# Patient Record
Sex: Female | Born: 1970 | Race: White | Hispanic: No | Marital: Married | Smoking: Never smoker
Health system: Southern US, Community
[De-identification: ages and names within clinical notes are randomized; demographics above are authoritative.]

## PROBLEM LIST (undated history)

## (undated) DIAGNOSIS — Z9189 Other specified personal risk factors, not elsewhere classified: Secondary | ICD-10-CM

## (undated) DIAGNOSIS — Z975 Presence of (intrauterine) contraceptive device: Secondary | ICD-10-CM

## (undated) HISTORY — DX: Presence of (intrauterine) contraceptive device: Z97.5

## (undated) HISTORY — DX: Other specified personal risk factors, not elsewhere classified: Z91.89

---

## 2007-11-16 ENCOUNTER — Ambulatory Visit: Payer: Self-pay

## 2008-07-28 HISTORY — PX: BREAST EXCISIONAL BIOPSY: SUR124

## 2008-11-22 ENCOUNTER — Ambulatory Visit: Payer: Self-pay

## 2009-01-25 HISTORY — PX: BREAST FIBROADENOMA SURGERY: SHX580

## 2009-02-09 ENCOUNTER — Ambulatory Visit: Payer: Self-pay | Admitting: Surgery

## 2009-02-16 ENCOUNTER — Ambulatory Visit: Payer: Self-pay | Admitting: Surgery

## 2010-02-14 ENCOUNTER — Ambulatory Visit: Payer: Self-pay | Admitting: Obstetrics and Gynecology

## 2013-01-25 LAB — HM MAMMOGRAPHY: HM Mammogram: NORMAL

## 2013-01-31 LAB — HM PAP SMEAR: HM PAP: NORMAL

## 2013-02-07 ENCOUNTER — Ambulatory Visit: Payer: Self-pay | Admitting: Obstetrics and Gynecology

## 2015-02-22 ENCOUNTER — Encounter: Payer: Self-pay | Admitting: Nurse Practitioner

## 2015-02-22 ENCOUNTER — Encounter (INDEPENDENT_AMBULATORY_CARE_PROVIDER_SITE_OTHER): Payer: Self-pay

## 2015-02-22 ENCOUNTER — Ambulatory Visit (INDEPENDENT_AMBULATORY_CARE_PROVIDER_SITE_OTHER): Payer: No Typology Code available for payment source | Admitting: Nurse Practitioner

## 2015-02-22 VITALS — BP 110/78 | HR 70 | Temp 97.7°F | Resp 14 | Ht 68.0 in | Wt 179.0 lb

## 2015-02-22 DIAGNOSIS — R6889 Other general symptoms and signs: Secondary | ICD-10-CM | POA: Diagnosis not present

## 2015-02-22 DIAGNOSIS — R198 Other specified symptoms and signs involving the digestive system and abdomen: Secondary | ICD-10-CM

## 2015-02-22 DIAGNOSIS — Z7689 Persons encountering health services in other specified circumstances: Secondary | ICD-10-CM | POA: Insufficient documentation

## 2015-02-22 DIAGNOSIS — R55 Syncope and collapse: Secondary | ICD-10-CM

## 2015-02-22 DIAGNOSIS — Z7189 Other specified counseling: Secondary | ICD-10-CM

## 2015-02-22 DIAGNOSIS — R194 Change in bowel habit: Secondary | ICD-10-CM

## 2015-02-22 NOTE — Progress Notes (Signed)
Pre visit review using our clinic review tool, if applicable. No additional management support is needed unless otherwise documented below in the visit note. 

## 2015-02-22 NOTE — Progress Notes (Signed)
Subjective:    Patient ID: Deanna Leonard, female    DOB: 1970-09-01, 44 y.o.   MRN: [id number]  HPI  Deanna Leonard is a 44 yo female establishing care and CC of forgetfulness and frequent BMs.  1) New pt info:   Immunizations- Unknown   Mammogram- 2014  Pap- 2014 OB/GYN  Eye Exam- 06/2014  Dental Exam- 08/2014   Mirena- IUD- maybe 5 years or less   2) Chronic Problems-  History of syncope- 1 year ago last episode, happened after a leg cramp at night. 3-4 years ago felt pre-syncopal    3) Acute Problems-  Forgetful- feels increasingly fuzzy and forgets names occasionally   Bowel movements- past few months, happens occasionally, coffee is a trigger, job stress has increased   Review of Systems  Constitutional: Negative for fever, chills, diaphoresis and fatigue.  Respiratory: Negative for chest tightness, shortness of breath and wheezing.   Cardiovascular: Negative for chest pain, palpitations and leg swelling.  Gastrointestinal: Negative for nausea, vomiting, diarrhea and constipation.  Skin: Negative for rash.  Neurological: Negative for dizziness, weakness, numbness and headaches.  Psychiatric/Behavioral: Positive for decreased concentration. The patient is not nervous/anxious.    Past Medical History  Diagnosis Date  . History of fainting     History   Social History  . Marital Status: Married    Spouse Name: N/A  . Number of Children: N/A  . Years of Education: N/A   Occupational History  . Not on file.   Social History Main Topics  . Smoking status: Never Smoker   . Smokeless tobacco: Never Used  . Alcohol Use: No  . Drug Use: No  . Sexual Activity:    Partners: Male    Birth Control/ Protection: IUD     Comment: Husband   Other Topics Concern  . 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 Breast fibroadenoma surgery  July 2010    Left side under arm    Family History  Problem Relation Age of Onset  . Cancer Mother     Colon Cancer  . Hypertension Mother   . Hypertension Father   . Diabetes Father   . Congestive Heart Failure Father   . Glaucoma Father   . Macular degeneration Father     No Known Allergies  No current outpatient prescriptions on file prior to visit.   No current facility-administered medications on file prior to visit.       Objective:   Physical Exam  Constitutional: She is oriented to person, place, and time. She appears well-developed and well-nourished. No distress.  BP 110/78 mmHg  Pulse 70  Temp(Src) 97.7 F (36.5 C)  Resp 14  Ht  (1.727 m)  Wt 179 lb (81.194 kg)  BMI 27.22 kg/m2  SpO2 98%   HENT:  Head: Normocephalic and atraumatic.  Right Ear: External ear normal.  Left Ear: External ear normal.  Cardiovascular: Normal rate, regular rhythm, normal heart sounds and intact distal pulses.  Exam reveals no gallop and no friction rub.   No murmur heard. Pulmonary/Chest: Effort normal and breath sounds normal. No respiratory distress. She has no wheezes. She has no rales. She exhibits no tenderness.  Neurological: She is alert and oriented  to person, place, and time. No cranial nerve deficit. She exhibits normal muscle tone. Coordination normal.  Skin: Skin is warm and dry. No rash noted. She is not diaphoretic.  Psychiatric: She has a normal mood and affect. Her behavior is normal. Judgment and thought content normal.      Assessment & Plan:

## 2015-02-22 NOTE — Patient Instructions (Addendum)
Schedule your physical exam. We will get fasting labs (nothing to eat or drink after midnight except water and black coffee).   Welcome to [facility]! Nice to meet you!

## 2015-03-04 DIAGNOSIS — R194 Change in bowel habit: Secondary | ICD-10-CM | POA: Insufficient documentation

## 2015-03-04 DIAGNOSIS — R55 Syncope and collapse: Secondary | ICD-10-CM | POA: Insufficient documentation

## 2015-03-04 DIAGNOSIS — R6889 Other general symptoms and signs: Secondary | ICD-10-CM | POA: Insufficient documentation

## 2015-03-04 NOTE — Assessment & Plan Note (Signed)
History of syncope. 1 year ago was her last episode. She reports pre-syncope.

## 2015-03-04 NOTE — Assessment & Plan Note (Signed)
Pt reports worsening frequency of BMs. She has had increased stress and reports coffee as a trigger. FU prn worsening/failure to improve.

## 2015-03-04 NOTE — Assessment & Plan Note (Signed)
Discussed with pt. Fuzziness can be from multiple etiologies. Discussed possibly due to increasing stress. Will follow.

## 2015-03-04 NOTE — Assessment & Plan Note (Signed)
Discussed acute and chronic issues. Reviewed health maintenance measures, PFSHx, and immunizations. Obtain records from previous facility.   

## 2015-03-14 ENCOUNTER — Encounter: Payer: No Typology Code available for payment source | Admitting: Nurse Practitioner

## 2015-03-26 ENCOUNTER — Encounter: Payer: No Typology Code available for payment source | Admitting: Nurse Practitioner

## 2015-04-04 ENCOUNTER — Encounter: Payer: No Typology Code available for payment source | Admitting: Nurse Practitioner

## 2015-04-04 ENCOUNTER — Encounter: Payer: Self-pay | Admitting: Nurse Practitioner

## 2015-04-04 ENCOUNTER — Encounter (INDEPENDENT_AMBULATORY_CARE_PROVIDER_SITE_OTHER): Payer: Self-pay

## 2015-04-04 ENCOUNTER — Ambulatory Visit (INDEPENDENT_AMBULATORY_CARE_PROVIDER_SITE_OTHER): Payer: No Typology Code available for payment source | Admitting: Nurse Practitioner

## 2015-04-04 VITALS — BP 110/80 | HR 70 | Temp 98.7°F | Resp 14 | Ht 68.0 in | Wt 177.8 lb

## 2015-04-04 DIAGNOSIS — Z0000 Encounter for general adult medical examination without abnormal findings: Secondary | ICD-10-CM | POA: Diagnosis not present

## 2015-04-04 DIAGNOSIS — Z23 Encounter for immunization: Secondary | ICD-10-CM | POA: Diagnosis not present

## 2015-04-04 LAB — COMPREHENSIVE METABOLIC PANEL
ALT: 23 U/L (ref 0–35)
AST: 16 U/L (ref 0–37)
Albumin: 4.4 g/dL (ref 3.5–5.2)
Alkaline Phosphatase: 59 U/L (ref 39–117)
BUN: 15 mg/dL (ref 6–23)
CHLORIDE: 103 meq/L (ref 96–112)
CO2: 27 mEq/L (ref 19–32)
CREATININE: 0.78 mg/dL (ref 0.40–1.20)
Calcium: 9.1 mg/dL (ref 8.4–10.5)
GFR: 84.75 mL/min (ref >60.00)
GLUCOSE: 88 mg/dL (ref 70–99)
POTASSIUM: 4.1 meq/L (ref 3.5–5.1)
Sodium: 137 mEq/L (ref 135–145)
Total Bilirubin: 0.7 mg/dL (ref 0.2–1.2)
Total Protein: 7.1 g/dL (ref 6.0–8.3)

## 2015-04-04 LAB — CBC WITH DIFFERENTIAL/PLATELET
BASOS ABS: 0 10*3/uL (ref 0.0–0.1)
Basophils Relative: 0.5 % (ref 0.0–3.0)
EOS ABS: 0 10*3/uL (ref 0.0–0.7)
Eosinophils Relative: 0.3 % (ref 0.0–5.0)
HCT: 43 % (ref 36.0–46.0)
Hemoglobin: 14.5 g/dL (ref 12.0–15.0)
LYMPHS ABS: 2.2 10*3/uL (ref 0.7–4.0)
Lymphocytes Relative: 31 % (ref 12.0–46.0)
MCHC: 33.6 g/dL (ref 30.0–36.0)
MCV: 98.7 fl (ref 78.0–100.0)
MONO ABS: 0.3 10*3/uL (ref 0.1–1.0)
Monocytes Relative: 4.5 % (ref 3.0–12.0)
Neutro Abs: 4.5 10*3/uL (ref 1.4–7.7)
Neutrophils Relative %: 63.7 % (ref 43.0–77.0)
Platelets: 236 10*3/uL (ref 150.0–400.0)
RBC: 4.36 Mil/uL (ref 3.87–5.11)
RDW: 12.8 % (ref 11.5–15.5)
WBC: 7.1 10*3/uL (ref 4.0–10.5)

## 2015-04-04 LAB — LIPID PANEL
CHOL/HDL RATIO: 4
Cholesterol: 169 mg/dL (ref 0–200)
HDL: 43.7 mg/dL (ref >39.00)
LDL CALC: 94 mg/dL (ref 0–99)
NonHDL: 125.69
Triglycerides: 157 mg/dL — ABNORMAL HIGH (ref 0.0–149.0)
VLDL: 31.4 mg/dL (ref 0.0–40.0)

## 2015-04-04 LAB — VITAMIN D 25 HYDROXY (VIT D DEFICIENCY, FRACTURES): VITD: 21.63 ng/mL — AB (ref 30.00–100.00)

## 2015-04-04 LAB — HEMOGLOBIN A1C: HEMOGLOBIN A1C: 5.3 % (ref 4.6–6.5)

## 2015-04-04 NOTE — Assessment & Plan Note (Signed)
Discussed acute and chronic issues. Reviewed health maintenance measures, PFSHx, and immunizations. Obtain routine labs TSH, Lipid panel, CBC w/ diff, A1c, Vitamin D, and CMET.   Tdap immunization updated today

## 2015-04-04 NOTE — Patient Instructions (Signed)

## 2015-04-04 NOTE — Progress Notes (Signed)
Pre visit review using our clinic review tool, if applicable. No additional management support is needed unless otherwise documented below in the visit note. 

## 2015-04-04 NOTE — Progress Notes (Signed)
Patient ID: Deanna Leonard, female    DOB: March 29, 1971  Age: 44 y.o. MRN: [id number]  CC: Annual Exam   HPI Deanna Leonard presents for annual physical exam.   1) Health Maintenance-   Diet- Eats healthy, cooks more often   Exercise- Walking intermittently, will start more often with cooler weather   Immunizations- Tdap unknown; refuses flu   Mammogram- 2014, needs referral   Pap- 01/31/2013  Eye Exam- 2015  Dental Exam- 02/2015  LMP- IUD  Breast exam- needs clinical exam  History Deanna Leonard has a past medical history of History of fainting.   She has past surgical history that includes Breast fibroadenoma surgery (July 2010).   Her family history includes Cancer in her mother; Congestive Heart Failure in her father; Diabetes in her father; Glaucoma in her father; Hypertension in her father and mother; Macular degeneration in her father.She reports that she has never smoked. She has never used smokeless tobacco. She reports that she does not drink alcohol or use illicit drugs.  No outpatient prescriptions prior to visit.   No facility-administered medications prior to visit.    ROS Review of Systems  Constitutional: Negative for fever, chills, diaphoresis and fatigue.  HENT: Negative for tinnitus and trouble swallowing.   Respiratory: Negative for chest tightness, shortness of breath and wheezing.   Cardiovascular: Negative for chest pain, palpitations and leg swelling.  Gastrointestinal: Negative for nausea, vomiting and diarrhea.  Genitourinary: Negative for difficulty urinating.  Musculoskeletal: Negative for back pain and neck pain.  Skin: Negative for rash.  Neurological: Negative for dizziness, weakness, numbness and headaches.  Psychiatric/Behavioral: Negative for suicidal ideas and sleep disturbance. The patient is not nervous/anxious.     Objective:  BP 110/80 mmHg  Pulse 70  Temp(Src) 98.7 F (37.1 C)  Resp 14  Ht  (1.727 m)  Wt 177 lb 12.8 oz (80.65 kg)   BMI 27.04 kg/m2  SpO2 97%  Physical Exam  Constitutional: She is oriented to person, place, and time. She appears well-developed and well-nourished. No distress.  HENT:  Head: Normocephalic and atraumatic.  Right Ear: External ear normal.  Left Ear: External ear normal.  Mouth/Throat: No oropharyngeal exudate.  Eyes: EOM are normal. Pupils are equal, round, and reactive to light. Right eye exhibits no discharge. Left eye exhibits no discharge. No scleral icterus.  Neck: Normal range of motion. Neck supple. No thyromegaly present.  Cardiovascular: Normal rate, regular rhythm and normal heart sounds.  Exam reveals no gallop and no friction rub.   No murmur heard. Pulmonary/Chest: Effort normal and breath sounds normal. No respiratory distress. She has no wheezes. She has no rales. She exhibits no tenderness.  Clinical breast exam performed and dense fibroglandular breast tissue palpated bilaterally   Abdominal: Soft. Bowel sounds are normal. She exhibits no distension and no mass. There is no tenderness. There is no rebound and no guarding.  Genitourinary:  Deferred till next year  Musculoskeletal: Normal range of motion. She exhibits no edema or tenderness.  Lymphadenopathy:    She has no cervical adenopathy.  Neurological: She is alert and oriented to person, place, and time. No cranial nerve deficit. She exhibits normal muscle tone. Coordination normal.  Skin: Skin is warm and dry. No rash noted. She is not diaphoretic.  Psychiatric: She has a normal mood and affect. Her behavior is normal. Judgment and thought content normal.   Assessment & Plan:   Deanna Leonard was seen today for annual exam.  Diagnoses and all orders  for this visit:  Routine general medical examination at a health care facility -     Comprehensive metabolic panel -     Lipid panel -     Hemoglobin A1c -     CBC with Differential/Platelet -     Vitamin D (25 hydroxy)  Need for Tdap vaccination -     Tdap vaccine  greater than or equal to 7yo IM   Deanna Leonard does not currently have medications on file.  No orders of the defined types were placed in this encounter.     Follow-up: Return in about 1 year (around 04/03/2016) for CPE.

## 2015-04-12 ENCOUNTER — Other Ambulatory Visit: Payer: Self-pay | Admitting: Obstetrics and Gynecology

## 2015-04-12 ENCOUNTER — Other Ambulatory Visit: Payer: Self-pay | Admitting: Nurse Practitioner

## 2015-04-12 DIAGNOSIS — Z1231 Encounter for screening mammogram for malignant neoplasm of breast: Secondary | ICD-10-CM

## 2015-04-24 ENCOUNTER — Ambulatory Visit: Payer: No Typology Code available for payment source

## 2015-05-15 ENCOUNTER — Ambulatory Visit
Admission: RE | Admit: 2015-05-15 | Discharge: 2015-05-15 | Disposition: A | Discharge status: Home / Self Care | Payer: No Typology Code available for payment source | Source: Ambulatory Visit | Attending: Nurse Practitioner | Admitting: Nurse Practitioner

## 2015-05-15 DIAGNOSIS — Z1231 Encounter for screening mammogram for malignant neoplasm of breast: Secondary | ICD-10-CM | POA: Insufficient documentation

## 2015-06-25 NOTE — Discharge Instructions (Signed)

## 2015-06-27 ENCOUNTER — Ambulatory Visit: Payer: No Typology Code available for payment source | Admitting: Anesthesiology

## 2015-06-27 ENCOUNTER — Encounter: Payer: Self-pay | Admitting: *Deleted

## 2015-06-27 ENCOUNTER — Ambulatory Visit
Admission: RE | Admit: 2015-06-27 | Discharge: 2015-06-27 | Disposition: A | Discharge status: Home / Self Care | Payer: No Typology Code available for payment source | Source: Ambulatory Visit | Attending: Ophthalmology | Admitting: Ophthalmology

## 2015-06-27 ENCOUNTER — Encounter
Admission: RE | Disposition: A | Discharge status: Home / Self Care | Payer: Self-pay | Source: Ambulatory Visit | Attending: Ophthalmology

## 2015-06-27 DIAGNOSIS — Z79899 Other long term (current) drug therapy: Secondary | ICD-10-CM | POA: Insufficient documentation

## 2015-06-27 DIAGNOSIS — H2512 Age-related nuclear cataract, left eye: Secondary | ICD-10-CM | POA: Diagnosis not present

## 2015-06-27 HISTORY — PX: CATARACT EXTRACTION W/PHACO: SHX586

## 2015-06-27 SURGERY — PHACOEMULSIFICATION, CATARACT, WITH IOL INSERTION
Anesthesia: Monitor Anesthesia Care | Laterality: Left | Wound class: Clean

## 2015-06-27 MED ORDER — MIDAZOLAM HCL 2 MG/2ML IJ SOLN
INTRAMUSCULAR | Status: DC | PRN
  Administered 2015-06-27 (×2): 2 mg via INTRAVENOUS

## 2015-06-27 MED ORDER — ACETAMINOPHEN 160 MG/5ML PO SOLN: 325.0000 mg | ORAL | Status: DC | PRN

## 2015-06-27 MED ORDER — CEFUROXIME OPHTHALMIC INJECTION 1 MG/0.1 ML
INJECTION | OPHTHALMIC | Status: DC | PRN
  Administered 2015-06-27: 0.1 mL via INTRACAMERAL

## 2015-06-27 MED ORDER — ARMC OPHTHALMIC DILATING GEL
1.0000 | OPHTHALMIC | Status: DC | PRN
Start: 2015-06-27 — End: 2015-06-27
  Administered 2015-06-27 (×2): 1 via OPHTHALMIC

## 2015-06-27 MED ORDER — TIMOLOL MALEATE 0.5 % OP SOLN
OPHTHALMIC | Status: DC | PRN
  Administered 2015-06-27: 1 [drp] via OPHTHALMIC

## 2015-06-27 MED ORDER — ACETAMINOPHEN 325 MG PO TABS: 325.0000 mg | ORAL_TABLET | ORAL | Status: DC | PRN

## 2015-06-27 MED ORDER — NA HYALUR & NA CHOND-NA HYALUR 0.4-0.35 ML IO KIT
PACK | INTRAOCULAR | Status: DC | PRN
  Administered 2015-06-27: 1 mL via INTRAOCULAR

## 2015-06-27 MED ORDER — POVIDONE-IODINE 5 % OP SOLN
1.0000 "application " | OPHTHALMIC | Status: DC | PRN
  Administered 2015-06-27: 1 via OPHTHALMIC

## 2015-06-27 MED ORDER — BRIMONIDINE TARTRATE 0.2 % OP SOLN
OPHTHALMIC | Status: DC | PRN
  Administered 2015-06-27: 1 [drp] via OPHTHALMIC

## 2015-06-27 MED ORDER — LIDOCAINE HCL (PF) 4 % IJ SOLN
INTRAOCULAR | Status: DC | PRN
  Administered 2015-06-27: 1 mL via OPHTHALMIC

## 2015-06-27 MED ORDER — FENTANYL CITRATE (PF) 100 MCG/2ML IJ SOLN
INTRAMUSCULAR | Status: DC | PRN
  Administered 2015-06-27: 100 ug via INTRAVENOUS

## 2015-06-27 MED ORDER — TETRACAINE HCL 0.5 % OP SOLN
1.0000 [drp] | OPHTHALMIC | Status: DC | PRN
  Administered 2015-06-27: 1 [drp] via OPHTHALMIC

## 2015-06-27 MED ORDER — EPINEPHRINE HCL 1 MG/ML IJ SOLN
INTRAOCULAR | Status: DC | PRN
  Administered 2015-06-27: 46 mL via OPHTHALMIC

## 2015-06-27 SURGICAL SUPPLY — 26 items
CANNULA ANT/CHMB 27GA (MISCELLANEOUS) ×3 IMPLANT
GLOVE SURG LX 7.5 STRW (GLOVE) ×2
GLOVE SURG LX STRL 7.5 STRW (GLOVE) ×1 IMPLANT
GLOVE SURG TRIUMPH 8.0 PF LTX (GLOVE) ×3 IMPLANT
GOWN STRL REUS W/ TWL LRG LVL3 (GOWN DISPOSABLE) ×2 IMPLANT
GOWN STRL REUS W/TWL LRG LVL3 (GOWN DISPOSABLE) ×4
LENS IOL TECNIS 13.5 (Intraocular Lens) ×3 IMPLANT
LENS IOL TECNIS MONO 1P 13.5 (Intraocular Lens) ×1 IMPLANT
MARKER SKIN SURG W/RULER VIO (MISCELLANEOUS) ×3 IMPLANT
NDL RETROBULBAR .5 NSTRL (NEEDLE) IMPLANT
NEEDLE FILTER BLUNT 18X 1/2SAF (NEEDLE) ×4
NEEDLE FILTER BLUNT 18X1 1/2 (NEEDLE) ×2 IMPLANT
PACK CATARACT BRASINGTON (MISCELLANEOUS) ×3 IMPLANT
PACK EYE AFTER SURG (MISCELLANEOUS) ×3 IMPLANT
PACK OPTHALMIC (MISCELLANEOUS) ×3 IMPLANT
RING MALYGIN 7.0 (MISCELLANEOUS) IMPLANT
SUT ETHILON 10-0 CS-B-6CS-B-6 (SUTURE)
SUT VICRYL  9 0 (SUTURE)
SUT VICRYL 9 0 (SUTURE) IMPLANT
SUTURE EHLN 10-0 CS-B-6CS-B-6 (SUTURE) IMPLANT
SYR 3ML LL SCALE MARK (SYRINGE) ×6 IMPLANT
SYR 5ML LL (SYRINGE) ×3 IMPLANT
SYR TB 1ML LUER SLIP (SYRINGE) ×3 IMPLANT
WATER STERILE IRR 250ML POUR (IV SOLUTION) ×3 IMPLANT
WATER STERILE IRR 500ML POUR (IV SOLUTION) IMPLANT
WIPE NON LINTING 3.25X3.25 (MISCELLANEOUS) ×3 IMPLANT

## 2015-06-27 NOTE — Op Note (Signed)
OPERATIVE NOTE  Deanna Leonard [id number] 06/27/2015   PREOPERATIVE DIAGNOSIS:  Nuclear sclerotic cataract left eye. H25.12   POSTOPERATIVE DIAGNOSIS:    Nuclear sclerotic cataract left eye.     PROCEDURE:  Phacoemusification with posterior chamber intraocular lens placement of the left eye   LENS:   Implant Name Type Inv. Item Serial No. Manufacturer Lot No. LRB No. Used  LENS IOL TECNIS 13.5 - [id number] Intraocular Lens LENS IOL TECNIS 13.5 [id number]   Left 1        ULTRASOUND TIME: 10  % of 0 minutes 26 seconds, CDE 2.7  SURGEON:  Deirdre Evenerhadwick R. Denzil Bristol, MD   ANESTHESIA:  Topical with tetracaine drops and 2% Xylocaine jelly, augmented with 1% preservative-free intracameral lidocaine.    COMPLICATIONS:  None.   DESCRIPTION OF PROCEDURE:  The patient was identified in the holding room and transported to the operating room and placed in the supine position under the operating microscope.  The left eye was identified as the operative eye and it was prepped and draped in the usual sterile ophthalmic fashion.   A 1 millimeter clear-corneal paracentesis was made at the 1:30 position.  0.5 ml of preservative-free 1% lidocaine was injected into the anterior chamber. The anterior chamber was filled with Viscoat viscoelastic.  A 2.4 millimeter keratome was used to make a near-clear corneal incision at the 10:30 position.  .  A curvilinear capsulorrhexis was made with a cystotome and capsulorrhexis forceps.  Balanced salt solution was used to hydrodissect and hydrodelineate the nucleus.   Phacoemulsification was then used in stop and chop fashion to remove the lens nucleus and epinucleus.  The remaining cortex was then removed using the irrigation and aspiration handpiece. Provisc was then placed into the capsular bag to distend it for lens placement.  A lens was then injected into the capsular bag.  The remaining viscoelastic was aspirated.   Wounds were hydrated with balanced  salt solution.  The anterior chamber was inflated to a physiologic pressure with balanced salt solution.  No wound leaks were noted. Cefuroxime 0.1 ml of a 10mg /ml solution was injected into the anterior chamber for a dose of 1 mg of intracameral antibiotic at the completion of the case.   Timolol and Brimonidine drops were applied to the eye.  The patient was taken to the recovery room in stable condition without complications of anesthesia or surgery.  Deanna Leonard 06/27/2015, 8:30 AM

## 2015-06-27 NOTE — Transfer of Care (Signed)
Immediate Anesthesia Transfer of Care Note  Patient: Deanna RomansNancy M Amon  Procedure(s) Performed: Procedure(s) with comments: CATARACT EXTRACTION PHACO AND INTRAOCULAR LENS PLACEMENT (IOC) (Left) - SHUGARCAINE  Patient Location: PACU  Anesthesia Type: MAC  Level of Consciousness: awake, alert  and patient cooperative  Airway and Oxygen Therapy: Patient Spontanous Breathing and Patient connected to supplemental oxygen  Post-op Assessment: Post-op Vital signs reviewed, Patient's Cardiovascular Status Stable, Respiratory Function Stable, Patent Airway and No signs of Nausea or vomiting  Post-op Vital Signs: Reviewed and stable  Complications: No apparent anesthesia complications

## 2015-06-27 NOTE — Anesthesia Postprocedure Evaluation (Signed)
Anesthesia Post Note  Patient: Deanna Leonard  Procedure(s) Performed: Procedure(s) (LRB): CATARACT EXTRACTION PHACO AND INTRAOCULAR LENS PLACEMENT (IOC) (Left)  Patient location during evaluation: PACU Anesthesia Type: MAC Level of consciousness: awake and alert Pain management: pain level controlled Vital Signs Assessment: post-procedure vital signs reviewed and stable Respiratory status: spontaneous breathing, nonlabored ventilation, respiratory function stable and patient connected to nasal cannula oxygen Cardiovascular status: blood pressure returned to baseline and stable Postop Assessment: no signs of nausea or vomiting Anesthetic complications: no    Deanna Leonard,  Ashaunte Standley G

## 2015-06-27 NOTE — H&P (Signed)
  The History and Physical notes are on paper, have been signed, and are to be scanned. The patient remains stable and unchanged from the H&P.   Previous H&P reviewed, patient examined, and there are no changes.  Deanna Leonard 06/27/2015 7:37 AM

## 2015-06-27 NOTE — Anesthesia Preprocedure Evaluation (Signed)
Anesthesia Evaluation  Patient identified by MRN, date of birth, ID band Patient awake    Reviewed: Patient's Chart, lab work & pertinent test results  Airway Mallampati: II  TM Distance: >3 FB Neck ROM: full    Dental   Pulmonary    Pulmonary exam normal        Cardiovascular Normal cardiovascular exam     Neuro/Psych    GI/Hepatic   Endo/Other    Renal/GU      Musculoskeletal   Abdominal   Peds  Hematology   Anesthesia Other Findings   Reproductive/Obstetrics                             Anesthesia Physical Anesthesia Plan  ASA: I  Anesthesia Plan: MAC   Post-op Pain Management:    Induction:   Airway Management Planned:   Additional Equipment:   Intra-op Plan:   Post-operative Plan:   Informed Consent: I have reviewed the patients History and Physical, chart, labs and discussed the procedure including the risks, benefits and alternatives for the proposed anesthesia with the patient or authorized representative who has indicated his/her understanding and acceptance.     Plan Discussed with: CRNA  Anesthesia Plan Comments:         Anesthesia Quick Evaluation

## 2015-06-27 NOTE — Anesthesia Procedure Notes (Signed)
Procedure Name: MAC Performed by: Mallorie Norrod Pre-anesthesia Checklist: Patient identified, Emergency Drugs available, Suction available, Timeout performed and Patient being monitored Patient Re-evaluated:Patient Re-evaluated prior to inductionOxygen Delivery Method: Nasal cannula Placement Confirmation: positive ETCO2     

## 2015-06-28 ENCOUNTER — Encounter: Payer: Self-pay | Admitting: Ophthalmology

## 2015-09-14 NOTE — Discharge Instructions (Signed)

## 2015-09-19 ENCOUNTER — Encounter
Admission: RE | Disposition: A | Discharge status: Home / Self Care | Payer: Self-pay | Source: Ambulatory Visit | Attending: Ophthalmology

## 2015-09-19 ENCOUNTER — Ambulatory Visit: Payer: No Typology Code available for payment source | Admitting: Anesthesiology

## 2015-09-19 ENCOUNTER — Ambulatory Visit
Admission: RE | Admit: 2015-09-19 | Discharge: 2015-09-19 | Disposition: A | Discharge status: Home / Self Care | Payer: No Typology Code available for payment source | Source: Ambulatory Visit | Attending: Ophthalmology | Admitting: Ophthalmology

## 2015-09-19 ENCOUNTER — Encounter: Payer: Self-pay | Admitting: *Deleted

## 2015-09-19 DIAGNOSIS — H2511 Age-related nuclear cataract, right eye: Secondary | ICD-10-CM | POA: Diagnosis present

## 2015-09-19 HISTORY — PX: CATARACT EXTRACTION W/PHACO: SHX586

## 2015-09-19 SURGERY — PHACOEMULSIFICATION, CATARACT, WITH IOL INSERTION
Anesthesia: Monitor Anesthesia Care | Site: Eye | Laterality: Right | Wound class: Clean

## 2015-09-19 MED ORDER — ARMC OPHTHALMIC DILATING GEL
1.0000 "application " | OPHTHALMIC | Status: DC | PRN
  Administered 2015-09-19 (×2): 1 via OPHTHALMIC

## 2015-09-19 MED ORDER — FENTANYL CITRATE (PF) 100 MCG/2ML IJ SOLN
INTRAMUSCULAR | Status: DC | PRN
  Administered 2015-09-19 (×2): 50 ug via INTRAVENOUS

## 2015-09-19 MED ORDER — LIDOCAINE HCL (PF) 4 % IJ SOLN
INTRAOCULAR | Status: DC | PRN
  Administered 2015-09-19: 1 mL via OPHTHALMIC

## 2015-09-19 MED ORDER — NA HYALUR & NA CHOND-NA HYALUR 0.4-0.35 ML IO KIT
PACK | INTRAOCULAR | Status: DC | PRN
  Administered 2015-09-19: 1 mL via INTRAOCULAR

## 2015-09-19 MED ORDER — BSS IO SOLN
INTRAOCULAR | Status: DC | PRN
  Administered 2015-09-19: 32 mL via OPHTHALMIC

## 2015-09-19 MED ORDER — TETRACAINE HCL 0.5 % OP SOLN
1.0000 [drp] | OPHTHALMIC | Status: DC | PRN
  Administered 2015-09-19: 1 [drp] via OPHTHALMIC

## 2015-09-19 MED ORDER — TIMOLOL MALEATE 0.5 % OP SOLN
OPHTHALMIC | Status: DC | PRN
Start: 2015-09-19 — End: 2015-09-19
  Administered 2015-09-19: 1 [drp] via OPHTHALMIC

## 2015-09-19 MED ORDER — CEFUROXIME OPHTHALMIC INJECTION 1 MG/0.1 ML
INJECTION | OPHTHALMIC | Status: DC | PRN
  Administered 2015-09-19: 0.1 mL via INTRACAMERAL

## 2015-09-19 MED ORDER — MIDAZOLAM HCL 2 MG/2ML IJ SOLN
INTRAMUSCULAR | Status: DC | PRN
  Administered 2015-09-19: 2 mg via INTRAVENOUS

## 2015-09-19 MED ORDER — BRIMONIDINE TARTRATE 0.2 % OP SOLN
OPHTHALMIC | Status: DC | PRN
  Administered 2015-09-19: 1 [drp] via OPHTHALMIC

## 2015-09-19 MED ORDER — POVIDONE-IODINE 5 % OP SOLN
1.0000 "application " | OPHTHALMIC | Status: DC | PRN
  Administered 2015-09-19: 1 via OPHTHALMIC

## 2015-09-19 SURGICAL SUPPLY — 27 items
CANNULA ANT/CHMB 27GA (MISCELLANEOUS) ×3 IMPLANT
CARTRIDGE ABBOTT (MISCELLANEOUS) ×3 IMPLANT
GLOVE SURG LX 7.5 STRW (GLOVE) ×2
GLOVE SURG LX STRL 7.5 STRW (GLOVE) ×1 IMPLANT
GLOVE SURG TRIUMPH 8.0 PF LTX (GLOVE) ×3 IMPLANT
GOWN STRL REUS W/ TWL LRG LVL3 (GOWN DISPOSABLE) ×2 IMPLANT
GOWN STRL REUS W/TWL LRG LVL3 (GOWN DISPOSABLE) ×4
LENS IOL TECNIS 14.5 (Intraocular Lens) ×3 IMPLANT
LENS IOL TECNIS MONO 1P 14.5 (Intraocular Lens) ×1 IMPLANT
MARKER SKIN DUAL TIP RULER LAB (MISCELLANEOUS) ×3 IMPLANT
NDL RETROBULBAR .5 NSTRL (NEEDLE) IMPLANT
NEEDLE FILTER BLUNT 18X 1/2SAF (NEEDLE) ×2
NEEDLE FILTER BLUNT 18X1 1/2 (NEEDLE) ×1 IMPLANT
PACK CATARACT BRASINGTON (MISCELLANEOUS) ×3 IMPLANT
PACK EYE AFTER SURG (MISCELLANEOUS) ×3 IMPLANT
PACK OPTHALMIC (MISCELLANEOUS) ×3 IMPLANT
RING MALYGIN 7.0 (MISCELLANEOUS) IMPLANT
SUT ETHILON 10-0 CS-B-6CS-B-6 (SUTURE)
SUT VICRYL  9 0 (SUTURE)
SUT VICRYL 9 0 (SUTURE) IMPLANT
SUTURE EHLN 10-0 CS-B-6CS-B-6 (SUTURE) IMPLANT
SYR 3ML LL SCALE MARK (SYRINGE) ×3 IMPLANT
SYR 5ML LL (SYRINGE) IMPLANT
SYR TB 1ML LUER SLIP (SYRINGE) ×3 IMPLANT
WATER STERILE IRR 250ML POUR (IV SOLUTION) ×3 IMPLANT
WATER STERILE IRR 500ML POUR (IV SOLUTION) IMPLANT
WIPE NON LINTING 3.25X3.25 (MISCELLANEOUS) ×3 IMPLANT

## 2015-09-19 NOTE — H&P (Signed)
  The History and Physical notes are on paper, have been signed, and are to be scanned. The patient remains stable and unchanged from the H&P.   Previous H&P reviewed, patient examined, and there are no changes.  Deanna Leonard 09/19/2015 7:32 AM  

## 2015-09-19 NOTE — Anesthesia Postprocedure Evaluation (Signed)
Anesthesia Post Note  Patient: Deanna Leonard  Procedure(s) Performed: Procedure(s) (LRB): CATARACT EXTRACTION PHACO AND INTRAOCULAR LENS PLACEMENT (IOC) (Right)  Patient location during evaluation: PACU Anesthesia Type: General Level of consciousness: awake and alert Pain management: pain level controlled Vital Signs Assessment: post-procedure vital signs reviewed and stable Respiratory status: spontaneous breathing, nonlabored ventilation, respiratory function stable and patient connected to nasal cannula oxygen Cardiovascular status: blood pressure returned to baseline and stable Postop Assessment: no signs of nausea or vomiting Anesthetic complications: no    Dorene Grebe

## 2015-09-19 NOTE — Transfer of Care (Signed)
Immediate Anesthesia Transfer of Care Note  Patient: Deanna Leonard  Procedure(s) Performed: Procedure(s) with comments: CATARACT EXTRACTION PHACO AND INTRAOCULAR LENS PLACEMENT (IOC) (Right) - SHUGARCAINE  Patient Location: PACU  Anesthesia Type: MAC  Level of Consciousness: awake, alert  and patient cooperative  Airway and Oxygen Therapy: Patient Spontanous Breathing and Patient connected to supplemental oxygen  Post-op Assessment: Post-op Vital signs reviewed, Patient's Cardiovascular Status Stable, Respiratory Function Stable, Patent Airway and No signs of Nausea or vomiting  Post-op Vital Signs: Reviewed and stable  Complications: No apparent anesthesia complications

## 2015-09-19 NOTE — Anesthesia Procedure Notes (Signed)
Procedure Name: MAC Performed by: Anatasia Tino Pre-anesthesia Checklist: Patient identified, Emergency Drugs available, Suction available, Patient being monitored and Timeout performed Patient Re-evaluated:Patient Re-evaluated prior to inductionOxygen Delivery Method: Nasal cannula     

## 2015-09-19 NOTE — Anesthesia Preprocedure Evaluation (Signed)
Anesthesia Evaluation  Patient identified by MRN, date of birth, ID band  Airway Mallampati: II  TM Distance: >3 FB     Dental   Pulmonary    breath sounds clear to auscultation       Cardiovascular  Rate:Normal     Neuro/Psych    GI/Hepatic   Endo/Other    Renal/GU      Musculoskeletal   Abdominal   Peds  Hematology   Anesthesia Other Findings   Reproductive/Obstetrics                             Anesthesia Physical Anesthesia Plan  ASA: I  Anesthesia Plan: MAC   Post-op Pain Management:    Induction:   Airway Management Planned:   Additional Equipment:   Intra-op Plan:   Post-operative Plan:   Informed Consent: I have reviewed the patients History and Physical, chart, labs and discussed the procedure including the risks, benefits and alternatives for the proposed anesthesia with the patient or authorized representative who has indicated his/her understanding and acceptance.     Plan Discussed with: CRNA  Anesthesia Plan Comments:         Anesthesia Quick Evaluation

## 2015-09-19 NOTE — Op Note (Signed)
LOCATION:  [facility]   PREOPERATIVE DIAGNOSIS:    Nuclear sclerotic cataract right eye. H25.11   POSTOPERATIVE DIAGNOSIS:  Nuclear sclerotic cataract right eye.     PROCEDURE:  Phacoemusification with posterior chamber intraocular lens placement of the right eye   LENS:   Implant Name Type Inv. Item Serial No. Manufacturer Lot No. LRB No. Used  LENS IOL TECNIS 14.5 - [id number] Intraocular Lens LENS IOL TECNIS 14.5 [id number] AMO   Right 1        ULTRASOUND TIME: 13 % of 0 minutes, 20 seconds.  CDE 2.6   SURGEON:  Deirdre Evener, MD   ANESTHESIA:  Topical with tetracaine drops and 2% Xylocaine jelly, augmented with 1% preservative-free intracameral lidocaine. .   COMPLICATIONS:  None.   DESCRIPTION OF PROCEDURE:  The patient was identified in the holding room and transported to the operating room and placed in the supine position under the operating microscope.  The right eye was identified as the operative eye and it was prepped and draped in the usual sterile ophthalmic fashion.   A 1 millimeter clear-corneal paracentesis was made at the 12:00 position.  0.5 ml of preservative-free 1% lidocaine was injected into the anterior chamber. The anterior chamber was filled with Viscoat viscoelastic.  A 2.4 millimeter keratome was used to make a near-clear corneal incision at the 9:00 position.  A curvilinear capsulorrhexis was made with a cystotome and capsulorrhexis forceps.  Balanced salt solution was used to hydrodissect and hydrodelineate the nucleus.   Phacoemulsification was then used in stop and chop fashion to remove the lens nucleus and epinucleus.  The remaining cortex was then removed using the irrigation and aspiration handpiece. Provisc was then placed into the capsular bag to distend it for lens placement.  A lens was then injected into the capsular bag.  The remaining viscoelastic was aspirated.   Wounds were hydrated with balanced salt solution.  The  anterior chamber was inflated to a physiologic pressure with balanced salt solution.  No wound leaks were noted. Cefuroxime 0.1 ml of a /ml solution was injected into the anterior chamber for a dose of 1 mg of intracameral antibiotic at the completion of the case.   Timolol and Brimonidine drops were applied to the eye.  The patient was taken to the recovery room in stable condition without complications of anesthesia or surgery.   Lineth Thielke 09/19/2015, 8:31 AM

## 2015-09-20 ENCOUNTER — Encounter: Payer: Self-pay | Admitting: Ophthalmology

## 2016-04-08 ENCOUNTER — Encounter: Payer: No Typology Code available for payment source | Admitting: Nurse Practitioner

## 2016-04-09 ENCOUNTER — Ambulatory Visit (INDEPENDENT_AMBULATORY_CARE_PROVIDER_SITE_OTHER): Payer: Managed Care, Other (non HMO) | Admitting: Family Medicine

## 2016-04-09 ENCOUNTER — Other Ambulatory Visit (HOSPITAL_COMMUNITY)
Admission: RE | Admit: 2016-04-09 | Discharge: 2016-04-09 | Disposition: A | Discharge status: Home / Self Care | Payer: Managed Care, Other (non HMO) | Source: Ambulatory Visit | Attending: Family Medicine | Admitting: Family Medicine

## 2016-04-09 ENCOUNTER — Encounter: Payer: Self-pay | Admitting: Family Medicine

## 2016-04-09 ENCOUNTER — Encounter: Payer: No Typology Code available for payment source | Admitting: Nurse Practitioner

## 2016-04-09 VITALS — BP 104/68 | HR 73 | Temp 98.4°F | Ht 68.0 in | Wt 181.8 lb

## 2016-04-09 DIAGNOSIS — Z1151 Encounter for screening for human papillomavirus (HPV): Secondary | ICD-10-CM | POA: Diagnosis not present

## 2016-04-09 DIAGNOSIS — Z1239 Encounter for other screening for malignant neoplasm of breast: Secondary | ICD-10-CM

## 2016-04-09 DIAGNOSIS — E559 Vitamin D deficiency, unspecified: Secondary | ICD-10-CM | POA: Diagnosis not present

## 2016-04-09 DIAGNOSIS — E663 Overweight: Secondary | ICD-10-CM

## 2016-04-09 DIAGNOSIS — Z13220 Encounter for screening for lipoid disorders: Secondary | ICD-10-CM | POA: Diagnosis not present

## 2016-04-09 DIAGNOSIS — Z124 Encounter for screening for malignant neoplasm of cervix: Secondary | ICD-10-CM

## 2016-04-09 DIAGNOSIS — Z01419 Encounter for gynecological examination (general) (routine) without abnormal findings: Secondary | ICD-10-CM | POA: Diagnosis present

## 2016-04-09 DIAGNOSIS — Z0000 Encounter for general adult medical examination without abnormal findings: Secondary | ICD-10-CM

## 2016-04-09 LAB — LIPID PANEL
CHOLESTEROL: 210 mg/dL — AB (ref 0–200)
HDL: 46.9 mg/dL (ref >39.00)
LDL CALC: 129 mg/dL — AB (ref 0–99)
NonHDL: 162.72
Total CHOL/HDL Ratio: 4
Triglycerides: 168 mg/dL — ABNORMAL HIGH (ref 0.0–149.0)
VLDL: 33.6 mg/dL (ref 0.0–40.0)

## 2016-04-09 LAB — COMPREHENSIVE METABOLIC PANEL
ALK PHOS: 65 U/L (ref 39–117)
ALT: 32 U/L (ref 0–35)
AST: 18 U/L (ref 0–37)
Albumin: 4.2 g/dL (ref 3.5–5.2)
BILIRUBIN TOTAL: 0.5 mg/dL (ref 0.2–1.2)
BUN: 13 mg/dL (ref 6–23)
CALCIUM: 9.1 mg/dL (ref 8.4–10.5)
CO2: 28 mEq/L (ref 19–32)
Chloride: 105 mEq/L (ref 96–112)
Creatinine, Ser: 0.69 mg/dL (ref 0.40–1.20)
GFR: 97.19 mL/min (ref >60.00)
Glucose, Bld: 78 mg/dL (ref 70–99)
Potassium: 4.1 mEq/L (ref 3.5–5.1)
Sodium: 138 mEq/L (ref 135–145)
TOTAL PROTEIN: 7.1 g/dL (ref 6.0–8.3)

## 2016-04-09 LAB — HEMOGLOBIN A1C: HEMOGLOBIN A1C: 5.3 % (ref 4.6–6.5)

## 2016-04-09 LAB — VITAMIN D 25 HYDROXY (VIT D DEFICIENCY, FRACTURES): VITD: 17.76 ng/mL — ABNORMAL LOW (ref 30.00–100.00)

## 2016-04-09 NOTE — Progress Notes (Signed)
Pre visit review using our clinic review tool, if applicable. No additional management support is needed unless otherwise documented below in the visit note. 

## 2016-04-09 NOTE — Patient Instructions (Signed)
Nice to meet you. We will call when your Pap smear returns. We will get you set up for mammogram. We'll check lab work and call you with the results. Please work on exercising at least 30 minutes several days a week. Please monitor your diet and eat plenty of fruits and vegetables. Try not to eat out as frequently. Try to make changes to 2-3 meals a week to start with and then go from there.

## 2016-04-09 NOTE — Progress Notes (Signed)
Tommi Rumps, MD Phone: [phone]  Deanna Leonard is a 45 y.o. female who presents today for physical exam.  Diet is described as could be better. Does get some fruits and vegetables. Has been drinking less soda and sweet tea. Notes she will do well for a period of time and then fall off the wagon. Does not exercise that much recently. Previously was doing to 3 nights a week 30 minutes to an hour. Is due for Pap smear. Mammogram last year. Due for new mammogram. She does report one of her parents died from some abdominal cancer that she is unsure what it as she will check on this and let us know so we can determine if she needs a colonoscopy. Declines flu shot. Up-to-date on tetanus vaccination. Up-to-date on HIV testing. No tobacco use. Rare alcohol use. No illicit drug use. Sees a dentist twice yearly. Ophthalmologist once a year.  Active Ambulatory Problems    Diagnosis Date Noted  . Syncope 03/04/2015  . Forgetfulness 03/04/2015  . Frequent bowel movements 03/04/2015  . Need for Tdap vaccination 04/04/2015  . Routine general medical examination at a health care facility 04/04/2015   Resolved Ambulatory Problems    Diagnosis Date Noted  . Encounter to establish care 02/22/2015   No Additional Past Medical History    Family History  Problem Relation Age of Onset  . Cancer Mother     Colon Cancer  . Hypertension Mother   . Hypertension Father   . Diabetes Father   . Congestive Heart Failure Father   . Glaucoma Father   . Macular degeneration Father     Social History   Social History  . Marital status: Married    Spouse name: N/A  . Number of children: N/A  . Years of education: N/A   Occupational History  . Not on file.   Social History Main Topics  . Smoking status: Never Smoker  . Smokeless tobacco: Never Used  . Alcohol use 0.0 oz/week     Comment: rarely  . Drug use: No  . Sexual activity: Yes    Partners: Male    Birth control/  protection: IUD     Comment: Husband   Other Topics Concern  . 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 CV:  Negative for chest pain, dyspnea, edema, palpitations Resp: Negative for cough, dyspnea, hemoptysis GI: Negative for nausea, vomiting, diarrhea, constipation, abdominal pain, melena, hematochezia. GU: Negative for dysuria, incontinence, urinary hesitance, hematuria, vaginal or penile discharge, polyuria, sexual difficulty, lumps in testicle or breasts MSK: Negative for muscle cramps or aches, joint pain or swelling Neuro: Negative for headaches, weakness, numbness, dizziness, passing out/fainting Psych: Negative for depression, anxiety, memory problems  Objective  Physical Exam Vitals:   04/09/16 0923  BP: 104/68  Pulse: 73  Temp: 98.4 F (36.9 C)    BP Readings from Last 3 Encounters:  04/09/16 104/68  09/19/15 102/62  06/27/15 100/63   Wt Readings from Last 3 Encounters:  04/09/16 181 lb 12.8 oz (82.5 kg)  09/19/15 177 lb (80.3 kg)  06/27/15 171 lb (77.6 kg)    Physical Exam  Constitutional: No distress.  HENT:  Head: Normocephalic and atraumatic.  Mouth/Throat: Oropharynx is clear and moist. No oropharyngeal exudate.  Eyes: Conjunctivae are normal. Pupils are equal, round, and reactive to light.  Cardiovascular: Normal rate, regular rhythm and normal heart sounds.   Pulmonary/Chest: Effort normal and breath sounds normal.  Abdominal: Soft. Bowel sounds are normal. She exhibits no distension. There is no tenderness. There is no rebound.  Genitourinary: Vagina normal, uterus normal,  cervix normal, right adnexa normal and left adnexa normal.  Genitourinary Comments: IUD strings visualized, breast exam with no masses or skin changes  Musculoskeletal: She exhibits no edema.  Neurological: She is alert. Gait normal.  Skin: Skin is warm and dry. She is not diaphoretic.  Psychiatric: Mood and affect normal.     Assessment/Plan:   Routine general medical examination at a health care facility Overall patient is doing well. I encouraged diet and exercise to help lose weight. Mammogram ordered. Pap smear completed and we will contact with results. She declined flu shot. She will find out from her sister what kind of cancer her parent had and contact us so we can determine whether or not she needs an early colonoscopy. Lab work as outlined below.   Orders Placed This Encounter  Procedures  . MM Digital Screening    Standing Status:   Future    Standing Expiration Date:   06/09/2017    Scheduling Instructions:     Schedule in late october    Order Specific Question:   Reason for Exam (SYMPTOM  OR DIAGNOSIS REQUIRED)    Answer:   breast cancer screen    Order Specific Question:   Is the patient pregnant?    Answer:   No    Order Specific Question:   Preferred imaging location?    Answer:   [facility]  . Comp Met (CMET)  . Vitamin D (25 hydroxy)  . Hemoglobin A1c  . Lipid Profile    No orders of the defined types were placed in this encounter.    Tommi Rumps, MD [place]

## 2016-04-10 LAB — CYTOLOGY - PAP

## 2016-04-10 NOTE — Assessment & Plan Note (Signed)
Overall patient is doing well. I encouraged diet and exercise to help lose weight. Mammogram ordered. Pap smear completed and we will contact with results. She declined flu shot. She will find out from her sister what kind of cancer her parent had and contact us so we can determine whether or not she needs an early colonoscopy. Lab work as outlined below.

## 2016-04-18 ENCOUNTER — Other Ambulatory Visit: Payer: Self-pay | Admitting: Family Medicine

## 2016-04-18 MED ORDER — VITAMIN D (ERGOCALCIFEROL) 1.25 MG (50000 UNIT) PO CAPS: 50000.0000 [IU] | ORAL_CAPSULE | ORAL | 0 refills | Status: DC

## 2016-04-28 ENCOUNTER — Telehealth: Payer: Self-pay | Admitting: *Deleted

## 2016-04-28 NOTE — Telephone Encounter (Signed)
Patient called to check the status of her mammogram order  Pt contact  [phone]

## 2016-04-30 ENCOUNTER — Telehealth: Payer: Self-pay | Admitting: *Deleted

## 2016-04-30 NOTE — Telephone Encounter (Signed)
Pt requested an update on her mammogram referral  Pt contact [phone]

## 2016-05-15 ENCOUNTER — Ambulatory Visit
Admission: RE | Admit: 2016-05-15 | Discharge: 2016-05-15 | Disposition: A | Discharge status: Home / Self Care | Payer: Managed Care, Other (non HMO) | Source: Ambulatory Visit | Attending: Family Medicine | Admitting: Family Medicine

## 2016-05-15 DIAGNOSIS — Z1231 Encounter for screening mammogram for malignant neoplasm of breast: Secondary | ICD-10-CM | POA: Insufficient documentation

## 2016-05-15 DIAGNOSIS — Z1239 Encounter for other screening for malignant neoplasm of breast: Secondary | ICD-10-CM

## 2016-08-12 ENCOUNTER — Other Ambulatory Visit: Payer: Self-pay | Admitting: Family Medicine

## 2016-08-12 DIAGNOSIS — E559 Vitamin D deficiency, unspecified: Secondary | ICD-10-CM

## 2016-08-12 NOTE — Telephone Encounter (Signed)
Last vit D lab 04/09/16 last filled 04/18/16

## 2016-08-13 NOTE — Telephone Encounter (Signed)
Patient should have vitamin D rechecked prior to refilling this medicine. Please get her set up for lab appointment. Thanks.

## 2016-08-15 NOTE — Telephone Encounter (Signed)
Left message to return call 

## 2016-08-19 NOTE — Telephone Encounter (Signed)
Left message to return call 

## 2016-08-28 NOTE — Telephone Encounter (Signed)
Unable to reach patient.

## 2016-09-03 ENCOUNTER — Other Ambulatory Visit: Payer: Self-pay | Admitting: Family Medicine

## 2017-04-10 ENCOUNTER — Encounter: Payer: Managed Care, Other (non HMO) | Admitting: Family Medicine

## 2017-04-10 ENCOUNTER — Encounter: Payer: Medicaid Other | Admitting: Family Medicine

## 2017-04-23 ENCOUNTER — Telehealth: Payer: Self-pay | Admitting: Family Medicine

## 2017-04-23 DIAGNOSIS — Z1239 Encounter for other screening for malignant neoplasm of breast: Secondary | ICD-10-CM

## 2017-04-23 NOTE — Telephone Encounter (Signed)
Please advise 

## 2017-04-23 NOTE — Telephone Encounter (Signed)
Ordered. Would suggest scheduling a physical as well. Thanks.

## 2017-04-23 NOTE — Telephone Encounter (Signed)
Patient is scheduled for physical

## 2017-04-23 NOTE — Telephone Encounter (Signed)
Pt called wanting to her Mammo done. Order needed please and thank you!  Call pt @ [phone].

## 2017-05-18 ENCOUNTER — Ambulatory Visit: Payer: Managed Care, Other (non HMO)

## 2017-05-19 ENCOUNTER — Other Ambulatory Visit: Payer: Self-pay | Admitting: Family Medicine

## 2017-05-19 ENCOUNTER — Ambulatory Visit
Admission: RE | Admit: 2017-05-19 | Discharge: 2017-05-19 | Disposition: A | Discharge status: Home / Self Care | Payer: Managed Care, Other (non HMO) | Source: Ambulatory Visit | Attending: Family Medicine | Admitting: Family Medicine

## 2017-05-19 ENCOUNTER — Ambulatory Visit: Admission: RE | Admit: 2017-05-19 | Payer: Managed Care, Other (non HMO) | Source: Ambulatory Visit

## 2017-05-19 DIAGNOSIS — Z1231 Encounter for screening mammogram for malignant neoplasm of breast: Secondary | ICD-10-CM | POA: Diagnosis present

## 2017-05-28 ENCOUNTER — Ambulatory Visit: Payer: Managed Care, Other (non HMO)

## 2017-07-20 ENCOUNTER — Encounter: Payer: Managed Care, Other (non HMO) | Admitting: Family Medicine

## 2017-08-24 ENCOUNTER — Encounter: Payer: Self-pay | Admitting: Family Medicine

## 2017-08-24 ENCOUNTER — Other Ambulatory Visit: Payer: Self-pay

## 2017-08-24 ENCOUNTER — Ambulatory Visit (INDEPENDENT_AMBULATORY_CARE_PROVIDER_SITE_OTHER): Payer: 59 | Admitting: Family Medicine

## 2017-08-24 VITALS — BP 108/84 | HR 82 | Temp 98.1°F | Ht 67.5 in | Wt 176.6 lb

## 2017-08-24 DIAGNOSIS — E559 Vitamin D deficiency, unspecified: Secondary | ICD-10-CM

## 2017-08-24 DIAGNOSIS — E663 Overweight: Secondary | ICD-10-CM

## 2017-08-24 DIAGNOSIS — Z800 Family history of malignant neoplasm of digestive organs: Secondary | ICD-10-CM | POA: Diagnosis not present

## 2017-08-24 DIAGNOSIS — Z0000 Encounter for general adult medical examination without abnormal findings: Secondary | ICD-10-CM

## 2017-08-24 DIAGNOSIS — Z13220 Encounter for screening for lipoid disorders: Secondary | ICD-10-CM

## 2017-08-24 DIAGNOSIS — Z130 Encounter for screening for diseases of the blood and blood-forming organs and certain disorders involving the immune mechanism: Secondary | ICD-10-CM | POA: Diagnosis not present

## 2017-08-24 DIAGNOSIS — Z975 Presence of (intrauterine) contraceptive device: Secondary | ICD-10-CM | POA: Diagnosis not present

## 2017-08-24 LAB — LIPID PANEL
CHOL/HDL RATIO: 4
Cholesterol: 213 mg/dL — ABNORMAL HIGH (ref 0–200)
HDL: 51.2 mg/dL (ref >39.00)
LDL Cholesterol: 136 mg/dL — ABNORMAL HIGH (ref 0–99)
NONHDL: 161.91
Triglycerides: 132 mg/dL (ref 0.0–149.0)
VLDL: 26.4 mg/dL (ref 0.0–40.0)

## 2017-08-24 LAB — CBC
HEMATOCRIT: 42.7 % (ref 36.0–46.0)
HEMOGLOBIN: 14.9 g/dL (ref 12.0–15.0)
MCHC: 34.8 g/dL (ref 30.0–36.0)
MCV: 94.9 fl (ref 78.0–100.0)
Platelets: 234 10*3/uL (ref 150.0–400.0)
RBC: 4.5 Mil/uL (ref 3.87–5.11)
RDW: 12.9 % (ref 11.5–15.5)
WBC: 7.2 10*3/uL (ref 4.0–10.5)

## 2017-08-24 LAB — VITAMIN D 25 HYDROXY (VIT D DEFICIENCY, FRACTURES): VITD: 13.1 ng/mL — AB (ref 30.00–100.00)

## 2017-08-24 LAB — COMPREHENSIVE METABOLIC PANEL
ALBUMIN: 4.5 g/dL (ref 3.5–5.2)
ALT: 28 U/L (ref 0–35)
AST: 18 U/L (ref 0–37)
Alkaline Phosphatase: 66 U/L (ref 39–117)
BUN: 19 mg/dL (ref 6–23)
CHLORIDE: 102 meq/L (ref 96–112)
CO2: 28 mEq/L (ref 19–32)
Calcium: 9.4 mg/dL (ref 8.4–10.5)
Creatinine, Ser: 0.81 mg/dL (ref 0.40–1.20)
GFR: 80.3 mL/min (ref >60.00)
Glucose, Bld: 96 mg/dL (ref 70–99)
POTASSIUM: 4.1 meq/L (ref 3.5–5.1)
SODIUM: 138 meq/L (ref 135–145)
TOTAL PROTEIN: 7.5 g/dL (ref 6.0–8.3)
Total Bilirubin: 0.8 mg/dL (ref 0.2–1.2)

## 2017-08-24 LAB — HEMOGLOBIN A1C: Hgb A1c MFr Bld: 5.6 % (ref 4.6–6.5)

## 2017-08-24 NOTE — Patient Instructions (Signed)
Nice to see you. We will get you to see GI and gynecology. We will check lab work today and contact you with the results. Please try to work on dietary changes and exercise.

## 2017-08-24 NOTE — Assessment & Plan Note (Signed)
Physical exam completed.  Encourage diet and exercise.  Pap smear up-to-date.  Discussed removal of IUD.  She is interested in having the IUD replaced.  Offered removal today though she deferred to have it removed and possibly be replaced at the same time through gynecology.  We will get her referred for colonoscopy given family history of colon cancer.  Lab work as outlined below.

## 2017-08-24 NOTE — Progress Notes (Addendum)
Tommi Rumps, MD Phone: [phone]  Deanna Leonard is a 47 y.o. female who presents today for physical exam.  Diet: Tries to watch what she eats. Exercises by walking though not much recently with the weather. Pap smear 2017 with negative cells and negative HPV.  She reports she has an IUD in place and thinks it may have been in place longer than 5 years. Mammogram up-to-date. She does report a history of colon cancer in her mom as well as liver cancer in her mom in her 11s.  Has not had a colonoscopy. HIV up-to-date. Tetanus vaccination up-to-date. Declines flu shot.   Active Ambulatory Problems    Diagnosis Date Noted  . Syncope 03/04/2015  . Forgetfulness 03/04/2015  . Frequent bowel movements 03/04/2015  . Need for Tdap vaccination 04/04/2015  . Routine general medical examination at a health care facility 04/04/2015   Resolved Ambulatory Problems    Diagnosis Date Noted  . Encounter to establish care 02/22/2015   No Additional Past Medical History    Family History  Problem Relation Age of Onset  . Cancer Mother 80       Colon Cancer  . Hypertension Mother   . Hypertension Father   . Diabetes Father   . Congestive Heart Failure Father   . Glaucoma Father   . Macular degeneration Father   . Breast cancer Neg Hx     Social History   Socioeconomic History  . Marital status: Married    Spouse name: Not on file  . Number of children: Not on file  . Years of education: Not on file  . Highest education level: Not on file  Social Needs  . Financial resource strain: Not on file  . Food insecurity - worry: Not on file  . Food insecurity - inability: Not on file  . Transportation needs - medical: Not on file  . Transportation needs - non-medical: Not on file  Occupational History  . Not on file  Tobacco Use  . Smoking status: Never Smoker  . Smokeless tobacco: Never Used  Substance and Sexual Activity  . Alcohol use: Yes    Alcohol/week: 0.0 oz   Comment: rarely  . Drug use: No  . Sexual activity: Yes    Partners: Male    Birth control/protection: IUD    Comment: Husband  Other Topics Concern  . 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 CV:  Negative for chest pain, dyspnea, edema, palpitations Resp: Negative for cough, dyspnea, hemoptysis GI: Negative for nausea, vomiting, diarrhea, constipation, abdominal pain, melena, hematochezia. GU: Negative for dysuria, incontinence, urinary hesitance, hematuria, vaginal or penile discharge, polyuria, sexual difficulty, lumps in testicle or breasts MSK: Negative for muscle cramps or aches, joint pain or swelling Neuro: Negative for headaches, weakness, numbness, dizziness, passing out/fainting Psych: Negative for depression, anxiety, memory problems  Objective  Physical Exam Vitals:   08/24/17 1122  BP: 108/84  Pulse: 82  Temp: 98.1 F (36.7 C)  SpO2: 99%    BP Readings from Last 3 Encounters:  08/24/17 108/84  04/09/16 104/68  09/19/15  102/62   Wt Readings from Last 3 Encounters:  08/24/17 176 lb 9.6 oz (80.1 kg)  04/09/16 181 lb 12.8 oz (82.5 kg)  09/19/15 177 lb (80.3 kg)    Physical Exam  Constitutional: No distress.  HENT:  Head: Normocephalic and atraumatic.  Mouth/Throat: Oropharynx is clear and moist. No oropharyngeal exudate.  Eyes: Conjunctivae are normal. Pupils are equal, round, and reactive to light.  Neck: Neck supple.  Cardiovascular: Normal rate, regular rhythm and normal heart sounds.  Pulmonary/Chest: Effort normal and breath sounds normal.  Abdominal: Soft. Bowel sounds are  normal. She exhibits no distension. There is no tenderness. There is no rebound and no guarding.  Genitourinary:  Genitourinary Comments: Bilateral breasts with no tenderness, masses, skin changes, or nipple inversion, no axillary masses bilaterally  Musculoskeletal: She exhibits no edema.  Lymphadenopathy:    She has no cervical adenopathy.  Neurological: She is alert. Gait normal.  Skin: Skin is warm and dry. She is not diaphoretic.  Psychiatric: Mood and affect normal.     Assessment/Plan:   Routine general medical examination at a health care facility Physical exam completed.  Encourage diet and exercise.  Pap smear up-to-date.  Discussed removal of IUD.  She is interested in having the IUD replaced.  Offered removal today though she deferred to have it removed and possibly be replaced at the same time through gynecology.  We will get her referred for colonoscopy given family history of colon cancer.  Lab work as outlined below.  GYN referral placed for removal of IUD.  Orders Placed This Encounter  Procedures  . Comp Met (CMET)  . CBC  . HgB A1c  . Lipid panel  . Vitamin D (25 hydroxy)  . Ambulatory referral to Gastroenterology    Referral Priority:   Routine    Referral Type:   Consultation    Referral Reason:   Specialty Services Required    Number of Visits Requested:   1  . Ambulatory referral to Gynecology    Referral Priority:   Routine    Referral Type:   Consultation    Referral Reason:   Specialty Services Required    Requested Specialty:   Gynecology    Number of Visits Requested:   1    No orders of the defined types were placed in this encounter.    Tommi Rumps, MD [place]

## 2017-08-25 MED ORDER — VITAMIN D (ERGOCALCIFEROL) 1.25 MG (50000 UNIT) PO CAPS: 50000.0000 [IU] | ORAL_CAPSULE | ORAL | 0 refills | Status: DC

## 2017-08-25 NOTE — Addendum Note (Signed)
Addended by: Birdie SonsSONNENBERG, ERIC G on: 08/25/2017 12:58 PM   Modules accepted: Orders

## 2017-08-25 NOTE — Progress Notes (Signed)
The 10-year ASCVD risk score Denman George(Goff DC Montez HagemanJr., et al., 2013) is: 0.8%   Values used to calculate the score:     Age: 47 years     Sex: Female     Is Non-Hispanic African American: No     Diabetic: No     Tobacco smoker: No     Systolic Blood Pressure: 108 mmHg     Is BP treated: No     HDL Cholesterol: 51.2 mg/dL     Total Cholesterol: 213 mg/dL

## 2017-08-27 ENCOUNTER — Telehealth: Payer: Self-pay | Admitting: Family Medicine

## 2017-08-27 NOTE — Telephone Encounter (Signed)
Copied from CRM [id number]. Topic: Referral - Status >> Aug 27, 2017 12:28 PM Stephannie LiSimmons, Janett L, NT wrote: Reason for CRM: Patient called to check on status of referrals please advise

## 2017-08-29 ENCOUNTER — Other Ambulatory Visit: Payer: Self-pay | Admitting: Family Medicine

## 2017-09-02 ENCOUNTER — Telehealth: Payer: Self-pay | Admitting: Gastroenterology

## 2017-09-02 ENCOUNTER — Encounter: Payer: Self-pay | Admitting: *Deleted

## 2017-09-02 NOTE — Telephone Encounter (Signed)
Patient called back to set up colonoscopy.

## 2017-09-03 ENCOUNTER — Other Ambulatory Visit: Payer: Self-pay

## 2017-09-03 DIAGNOSIS — Z1211 Encounter for screening for malignant neoplasm of colon: Secondary | ICD-10-CM

## 2017-09-03 NOTE — Telephone Encounter (Signed)
Gastroenterology Pre-Procedure Review  Request Date: 10/06/17 Requesting Physician: Dr. Maximino Greenlandahiliani  PATIENT REVIEW QUESTIONS: The patient responded to the following health history questions as indicated:    1. Are you having any GI issues? no 2. Do you have a personal history of Polyps? no 3. Do you have a family history of Colon Cancer or Polyps? yes (mother colon cancer) 4. Diabetes Mellitus? no 5. Joint replacements in the past 12 months?no 6. Major health problems in the past 3 months?no 7. Any artificial heart valves, MVP, or defibrillator?no    MEDICATIONS & ALLERGIES:    Patient reports the following regarding taking any anticoagulation/antiplatelet therapy:   Plavix, Coumadin, Eliquis, Xarelto, Lovenox, Pradaxa, Brilinta, or Effient? no Aspirin? no  Patient confirms/reports the following medications:  Current Outpatient Medications  Medication Sig Dispense Refill  . Vitamin D, Ergocalciferol, (DRISDOL) 50000 units CAPS capsule Take 1 capsule (50,000 Units total) by mouth every 7 (seven) days. 8 capsule 0   No current facility-administered medications for this visit.     Patient confirms/reports the following allergies:  No Known Allergies  No orders of the defined types were placed in this encounter.   AUTHORIZATION INFORMATION Primary Insurance: 1D#: Group #:  Secondary Insurance: 1D#: Group #:  SCHEDULE INFORMATION: Date: 10/06/17 Time: Location:[facility]

## 2017-09-21 ENCOUNTER — Telehealth: Payer: Self-pay

## 2017-09-21 NOTE — Telephone Encounter (Signed)
Copied from CRM [id number]. Topic: Referral - Status >> Sep 21, 2017 12:53 PM Crist InfanteHarrald, Kathy J wrote: Reason for CRM: [facility] pt and will fax results late today Returned Rasheeda call

## 2017-09-30 ENCOUNTER — Telehealth: Payer: Self-pay | Admitting: Gastroenterology

## 2017-09-30 NOTE — Telephone Encounter (Signed)
Patient needs to r/s her procedure. 

## 2017-10-01 ENCOUNTER — Telehealth: Payer: Self-pay

## 2017-10-01 ENCOUNTER — Other Ambulatory Visit: Payer: Self-pay

## 2017-10-01 NOTE — Telephone Encounter (Signed)
Returned patients call-she stated that someone returned her call yesterday.  Had Rescheduled her  for 11/17/17. Thanks [employer] FinancialMichelle

## 2017-10-01 NOTE — Telephone Encounter (Signed)
Returned patient's call.   LVM for patient callback to reschedule.

## 2017-10-20 ENCOUNTER — Other Ambulatory Visit: Payer: Self-pay | Admitting: Family Medicine

## 2017-10-20 ENCOUNTER — Other Ambulatory Visit (INDEPENDENT_AMBULATORY_CARE_PROVIDER_SITE_OTHER): Payer: 59

## 2017-10-20 DIAGNOSIS — E559 Vitamin D deficiency, unspecified: Secondary | ICD-10-CM | POA: Diagnosis not present

## 2017-10-20 LAB — VITAMIN D 25 HYDROXY (VIT D DEFICIENCY, FRACTURES): VITD: 24.7 ng/mL — ABNORMAL LOW (ref 30.00–100.00)

## 2017-10-20 NOTE — Progress Notes (Signed)
Voicemail left for patient to call office back for lab results. Okay for [facility] to give patient lab results.

## 2017-10-21 ENCOUNTER — Other Ambulatory Visit: Payer: Self-pay | Admitting: Family Medicine

## 2017-10-21 NOTE — Telephone Encounter (Signed)
Last OV 08/24/17 last filled 08/25/17 8 0rf

## 2017-10-24 NOTE — Telephone Encounter (Signed)
Patient no longer needs this medication.  Please see the result note regarding her recent vitamin D check.  She needs to start on over-the-counter vitamin D 1000 international units daily.  Thanks.

## 2017-11-04 ENCOUNTER — Other Ambulatory Visit: Payer: Self-pay | Admitting: Family Medicine

## 2017-11-05 ENCOUNTER — Telehealth: Payer: Self-pay | Admitting: Family Medicine

## 2017-11-05 NOTE — Telephone Encounter (Signed)
Left VM to return call, charted in result notes. 

## 2017-11-05 NOTE — Telephone Encounter (Signed)
Copied from CRM [phone]. Topic: Quick Communication - See Telephone Encounter >> Nov 05, 2017  2:49 PM Alexander BergeronBarksdale, Harvey B wrote: Call pt to give labs

## 2017-11-10 ENCOUNTER — Telehealth: Payer: Self-pay

## 2017-11-10 NOTE — Telephone Encounter (Signed)
Pt called and due to work schedule she needed to reschedule her colonoscopy from 4/23 to 01/26/18, while she is on vacation. This was done and if there are any changes we will notify pt.

## 2018-01-20 ENCOUNTER — Telehealth: Payer: Self-pay | Admitting: Gastroenterology

## 2018-01-20 NOTE — Telephone Encounter (Signed)
Pt left vm she states she has procedure 01/26/18 and she is calling to cancel she has a trip planned

## 2018-01-21 NOTE — Telephone Encounter (Signed)
LMTCO.

## 2018-01-22 NOTE — Telephone Encounter (Signed)
LMTCO. Will also send message via my chart.

## 2018-02-04 ENCOUNTER — Other Ambulatory Visit (INDEPENDENT_AMBULATORY_CARE_PROVIDER_SITE_OTHER): Payer: 59

## 2018-02-04 DIAGNOSIS — E559 Vitamin D deficiency, unspecified: Secondary | ICD-10-CM

## 2018-02-04 LAB — VITAMIN D 25 HYDROXY (VIT D DEFICIENCY, FRACTURES): VITD: 19.4 ng/mL — AB (ref 30.00–100.00)

## 2018-02-16 ENCOUNTER — Ambulatory Visit: Admission: RE | Admit: 2018-02-16 | Payer: 59 | Source: Ambulatory Visit | Admitting: Gastroenterology

## 2018-02-16 ENCOUNTER — Encounter: Admission: RE | Payer: Self-pay | Source: Ambulatory Visit

## 2018-02-16 SURGERY — COLONOSCOPY WITH PROPOFOL
Anesthesia: Choice

## 2018-04-23 ENCOUNTER — Other Ambulatory Visit: Payer: Self-pay | Admitting: Family Medicine

## 2018-05-20 ENCOUNTER — Ambulatory Visit
Admission: RE | Admit: 2018-05-20 | Discharge: 2018-05-20 | Disposition: A | Discharge status: Home / Self Care | Payer: 59 | Source: Ambulatory Visit | Attending: Family Medicine | Admitting: Family Medicine

## 2018-05-20 DIAGNOSIS — Z1239 Encounter for other screening for malignant neoplasm of breast: Secondary | ICD-10-CM | POA: Insufficient documentation

## 2018-08-25 ENCOUNTER — Encounter: Payer: 59 | Admitting: Family Medicine

## 2018-12-08 ENCOUNTER — Encounter: Payer: 59 | Admitting: Family Medicine

## 2019-02-22 ENCOUNTER — Ambulatory Visit (INDEPENDENT_AMBULATORY_CARE_PROVIDER_SITE_OTHER): Payer: 59 | Admitting: Family Medicine

## 2019-02-22 ENCOUNTER — Telehealth: Payer: Self-pay

## 2019-02-22 ENCOUNTER — Encounter: Payer: Self-pay | Admitting: Family Medicine

## 2019-02-22 ENCOUNTER — Other Ambulatory Visit: Payer: Self-pay

## 2019-02-22 VITALS — BP 120/70 | HR 90 | Temp 99.0°F | Ht 67.0 in | Wt 180.8 lb

## 2019-02-22 DIAGNOSIS — Z0000 Encounter for general adult medical examination without abnormal findings: Secondary | ICD-10-CM | POA: Diagnosis not present

## 2019-02-22 DIAGNOSIS — Z130 Encounter for screening for diseases of the blood and blood-forming organs and certain disorders involving the immune mechanism: Secondary | ICD-10-CM

## 2019-02-22 DIAGNOSIS — Z13220 Encounter for screening for lipoid disorders: Secondary | ICD-10-CM | POA: Diagnosis not present

## 2019-02-22 DIAGNOSIS — Z1329 Encounter for screening for other suspected endocrine disorder: Secondary | ICD-10-CM | POA: Diagnosis not present

## 2019-02-22 DIAGNOSIS — Z1239 Encounter for other screening for malignant neoplasm of breast: Secondary | ICD-10-CM

## 2019-02-22 DIAGNOSIS — E663 Overweight: Secondary | ICD-10-CM

## 2019-02-22 DIAGNOSIS — E559 Vitamin D deficiency, unspecified: Secondary | ICD-10-CM

## 2019-02-22 DIAGNOSIS — Z800 Family history of malignant neoplasm of digestive organs: Secondary | ICD-10-CM

## 2019-02-22 LAB — COMPREHENSIVE METABOLIC PANEL
ALT: 18 U/L (ref 0–35)
AST: 15 U/L (ref 0–37)
Albumin: 4.5 g/dL (ref 3.5–5.2)
Alkaline Phosphatase: 84 U/L (ref 39–117)
BUN: 12 mg/dL (ref 6–23)
CO2: 27 mEq/L (ref 19–32)
Calcium: 9.4 mg/dL (ref 8.4–10.5)
Chloride: 104 mEq/L (ref 96–112)
Creatinine, Ser: 0.8 mg/dL (ref 0.40–1.20)
GFR: 76.16 mL/min (ref >60.00)
Glucose, Bld: 83 mg/dL (ref 70–99)
Potassium: 4.2 mEq/L (ref 3.5–5.1)
Sodium: 140 mEq/L (ref 135–145)
Total Bilirubin: 0.9 mg/dL (ref 0.2–1.2)
Total Protein: 6.9 g/dL (ref 6.0–8.3)

## 2019-02-22 LAB — CBC
HCT: 41.6 % (ref 36.0–46.0)
Hemoglobin: 14.5 g/dL (ref 12.0–15.0)
MCHC: 34.8 g/dL (ref 30.0–36.0)
MCV: 96.9 fl (ref 78.0–100.0)
Platelets: 240 10*3/uL (ref 150.0–400.0)
RBC: 4.29 Mil/uL (ref 3.87–5.11)
RDW: 13.2 % (ref 11.5–15.5)
WBC: 6.2 10*3/uL (ref 4.0–10.5)

## 2019-02-22 LAB — LIPID PANEL
Cholesterol: 188 mg/dL (ref 0–200)
HDL: 44.9 mg/dL (ref >39.00)
LDL Cholesterol: 111 mg/dL — ABNORMAL HIGH (ref 0–99)
NonHDL: 143.3
Total CHOL/HDL Ratio: 4
Triglycerides: 161 mg/dL — ABNORMAL HIGH (ref 0.0–149.0)
VLDL: 32.2 mg/dL (ref 0.0–40.0)

## 2019-02-22 LAB — TSH: TSH: 1.14 u[IU]/mL (ref 0.35–4.50)

## 2019-02-22 LAB — HEMOGLOBIN A1C: Hgb A1c MFr Bld: 5.4 % (ref 4.6–6.5)

## 2019-02-22 LAB — VITAMIN D 25 HYDROXY (VIT D DEFICIENCY, FRACTURES): VITD: 18.05 ng/mL — ABNORMAL LOW (ref 30.00–100.00)

## 2019-02-22 NOTE — Patient Instructions (Signed)
Nice to see you. Please work on diet and exercise as discussed.  We will get labs an call with the results.

## 2019-02-22 NOTE — Telephone Encounter (Signed)
Copied from [place] [id number]. Topic: Appointment Scheduling - Scheduling Inquiry for Clinic >> Feb 22, 2019  7:41 AM Lennox Solders wrote: Reason for CRM:pt is calling to do covid 19 screening. Pt has an appt at 10 am

## 2019-02-22 NOTE — Assessment & Plan Note (Signed)
Physical exam completed.  Encouraged increasing activity and decreasing sugary and fatty food intake.  Discussed performing a pelvic exam today though she deferred this to next year.  Pap smear is up-to-date.  Mammogram ordered.  Discussed the importance of colonoscopy for colon cancer screening given her mother's history of colon cancer.  Recommended having this done as soon as she is able to.  Referral to GI placed so that the patient can arrange for this to be done as soon as she is able to have it done.  Labs as outlined below.

## 2019-02-22 NOTE — Progress Notes (Signed)
Tommi Rumps, MD Phone: [phone]  Deanna Leonard is a 48 y.o. female who presents today for CPE.  Exercise: Not exercising very much now given the heat though she was walking 2-3 times a week previously. Diet: She is getting vegetables.  She does eat some stuff that she should not.  She has cut down on her sugar to a certain degree.  Not much soda or sweet tea. Pap smear up-to-date on 04/09/2016 with negative cells and negative HPV.  She defers pelvic exam today. She notes no menstrual cycles given that she currently has an IUD which was inserted last year. She is sexually active with one partner who is a female. Mammogram is up-to-date and is due in October.  Mammogram was negative. No family history of breast cancer or ovarian cancer.  Her mother had a history of colon cancer at age 72.  The patient has opted to defer her colonoscopy given cost concerns until she is age 24. Tetanus vaccine up-to-date. No tobacco use or illicit drug use.  Rare alcohol use. Sees a dentist twice yearly.  Due to see ophthalmology.   Active Ambulatory Problems    Diagnosis Date Noted  . Syncope 03/04/2015  . Forgetfulness 03/04/2015  . Frequent bowel movements 03/04/2015  . Need for Tdap vaccination 04/04/2015  . Routine general medical examination at a health care facility 04/04/2015  . Family history of colon cancer in mother 02/22/2019   Resolved Ambulatory Problems    Diagnosis Date Noted  . Encounter to establish care 02/22/2015   No Additional Past Medical History    Family History  Problem Relation Age of Onset  . Cancer Mother 33       Colon Cancer  . Hypertension Mother   . Hypertension Father   . Diabetes Father   . Congestive Heart Failure Father   . Glaucoma Father   . Macular degeneration Father   . Breast cancer Neg Hx     Social History   Socioeconomic History  . Marital status: Married    Spouse name: Not on file  . Number of children: Not on file  . Years of  education: Not on file  . Highest education level: Not on file  Occupational History  . Not on file  Social Needs  . Financial resource strain: Not on file  . Food insecurity    Worry: Not on file    Inability: Not on file  . Transportation needs    Medical: Not on file    Non-medical: Not on file  Tobacco Use  . Smoking status: Never Smoker  . Smokeless tobacco: Never Used  Substance and Sexual Activity  . Alcohol use: Yes    Alcohol/week: 0.0 standard drinks    Comment: rarely  . Drug use: No  . Sexual activity: Yes    Partners: Male    Birth control/protection: I.U.D.    Comment: Husband  Lifestyle  . Physical activity    Days per week: Not on file    Minutes per session: Not on file  . Stress: Not on file  Relationships  . Social Herbalist on phone: Not on file    Gets together: Not on file    Attends religious service: Not on file    Active member of club or organization: Not on file    Attends meetings of clubs or organizations: Not on file    Relationship status: Not on file  . Intimate partner violence  Fear of current or ex partner: Not on file    Emotionally abused: Not on file    Physically abused: Not on file    Forced sexual activity: Not on file  Other Topics Concern  . 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 CV:  Negative for chest pain, dyspnea, edema, palpitations Resp: Negative for cough, dyspnea, hemoptysis GI: Negative for nausea, vomiting, diarrhea, constipation, abdominal pain, melena, hematochezia. GU: Negative for dysuria, incontinence, urinary hesitance,  hematuria, vaginal or penile discharge, polyuria, sexual difficulty, lumps in testicle or breasts MSK: Negative for muscle cramps or aches, joint pain or swelling Neuro: Negative for headaches, weakness, numbness, dizziness, passing out/fainting Psych: Negative for depression, anxiety, memory problems  Objective  Physical Exam Vitals:   02/22/19 1002  BP: 120/70  Pulse: 90  Temp: 99 F (37.2 C)  SpO2: 97%    BP Readings from Last 3 Encounters:  02/22/19 120/70  08/24/17 108/84  04/09/16 104/68   Wt Readings from Last 3 Encounters:  02/22/19 180 lb 12.8 oz (82 kg)  08/24/17 176 lb 9.6 oz (80.1 kg)  04/09/16 181 lb 12.8 oz (82.5 kg)    Physical Exam Constitutional:      General: She is not in acute distress.    Appearance: She is not diaphoretic.  HENT:     Head: Normocephalic and atraumatic.  Eyes:     Conjunctiva/sclera: Conjunctivae normal.     Pupils: Pupils are equal, round, and reactive to light.  Cardiovascular:     Rate and Rhythm: Normal rate and regular rhythm.     Heart sounds: Normal heart sounds.  Pulmonary:     Effort: Pulmonary effort is normal.     Breath sounds: Normal breath sounds.  Abdominal:     General: Bowel sounds are normal. There is no distension.     Palpations: Abdomen is soft.     Tenderness: There is no abdominal tenderness. There is no guarding or rebound.  Genitourinary:    Comments: Chaperone used, bilateral breast with no masses, tenderness, nipple inversion, or skin changes, no axillary masses bilaterally Musculoskeletal:     Right lower leg: No edema.     Left lower leg: No edema.  Lymphadenopathy:     Cervical: No cervical adenopathy.  Skin:    General: Skin is warm and dry.  Neurological:     Mental Status: She is alert.  Psychiatric:        Mood and Affect: Mood normal.      Assessment/Plan:   Routine general medical examination at a health care facility Physical exam completed.  Encouraged increasing activity and  decreasing sugary and fatty food intake.  Discussed performing a pelvic exam today though she deferred this to next year.  Pap smear is up-to-date.  Mammogram ordered.  Discussed the importance of colonoscopy for colon cancer screening given her mother's history of colon cancer.  Recommended having this done as soon as she is able to.  Referral to GI placed so that the patient can arrange for this to be done as soon as she is able  to have it done.  Labs as outlined below.   Orders Placed This Encounter  Procedures  . MM 3D SCREEN BREAST BILATERAL    Standing Status:   Future    Standing Expiration Date:   04/24/2020    Order Specific Question:   Reason for Exam (SYMPTOM  OR DIAGNOSIS REQUIRED)    Answer:   breast cancer screening    Order Specific Question:   Is the patient pregnant?    Answer:   No    Order Specific Question:   Preferred imaging location?    Answer:   [facility]  . Vitamin D (25 hydroxy)  . Lipid panel  . Comp Met (CMET)  . CBC  . TSH  . HgB A1c  . Ambulatory referral to Gastroenterology    Referral Priority:   Routine    Referral Type:   Consultation    Referral Reason:   Specialty Services Required    Number of Visits Requested:   1    No orders of the defined types were placed in this encounter.    Tommi Rumps, MD [place]

## 2019-02-27 ENCOUNTER — Other Ambulatory Visit: Payer: Self-pay | Admitting: Family Medicine

## 2019-02-27 DIAGNOSIS — E559 Vitamin D deficiency, unspecified: Secondary | ICD-10-CM

## 2019-02-27 MED ORDER — VITAMIN D (ERGOCALCIFEROL) 1.25 MG (50000 UNIT) PO CAPS: 50000.0000 [IU] | ORAL_CAPSULE | ORAL | 0 refills | Status: DC

## 2019-03-03 ENCOUNTER — Encounter: Payer: Self-pay | Admitting: *Deleted

## 2019-03-09 ENCOUNTER — Encounter: Payer: Self-pay | Admitting: *Deleted

## 2019-03-24 NOTE — Telephone Encounter (Signed)
Mailed unread my chart message to patient

## 2019-04-23 ENCOUNTER — Other Ambulatory Visit: Payer: Self-pay | Admitting: Family Medicine

## 2019-05-24 ENCOUNTER — Ambulatory Visit
Admission: RE | Admit: 2019-05-24 | Discharge: 2019-05-24 | Disposition: A | Discharge status: Home / Self Care | Payer: 59 | Source: Ambulatory Visit | Attending: Family Medicine | Admitting: Family Medicine

## 2019-05-24 ENCOUNTER — Other Ambulatory Visit: Payer: Self-pay

## 2019-05-24 DIAGNOSIS — Z1239 Encounter for other screening for malignant neoplasm of breast: Secondary | ICD-10-CM

## 2019-05-24 DIAGNOSIS — Z1231 Encounter for screening mammogram for malignant neoplasm of breast: Secondary | ICD-10-CM | POA: Diagnosis not present

## 2019-07-12 ENCOUNTER — Other Ambulatory Visit: Payer: Self-pay | Admitting: Family Medicine

## 2020-02-29 ENCOUNTER — Encounter: Payer: 59 | Admitting: Family Medicine

## 2020-04-05 ENCOUNTER — Other Ambulatory Visit: Payer: Self-pay | Admitting: Family Medicine

## 2020-04-16 ENCOUNTER — Telehealth: Payer: Self-pay | Admitting: Family Medicine

## 2020-04-16 DIAGNOSIS — Z1231 Encounter for screening mammogram for malignant neoplasm of breast: Secondary | ICD-10-CM

## 2020-04-16 NOTE — Telephone Encounter (Signed)
Pt needs an order for mammogram. She normally goes to [facility] to get it done. She had to reschedule her appointment for tomorrow due to her son being tested for covid.

## 2020-04-17 ENCOUNTER — Encounter: Payer: 59 | Admitting: Family Medicine

## 2020-04-17 NOTE — Telephone Encounter (Signed)
I called and spoke with the patient and informed her that the provider ordered her mammogram and she could call and schedule and she understood.  Dariusz Brase,cma

## 2020-04-17 NOTE — Telephone Encounter (Signed)
Mammogram ordered. She can call to schedule.

## 2020-04-17 NOTE — Addendum Note (Signed)
Addended by: Glori Luis on: 04/17/2020 02:13 PM   Modules accepted: Orders

## 2020-05-24 ENCOUNTER — Other Ambulatory Visit: Payer: Self-pay

## 2020-05-24 ENCOUNTER — Ambulatory Visit
Admission: RE | Admit: 2020-05-24 | Discharge: 2020-05-24 | Disposition: A | Discharge status: Home / Self Care | Payer: No Typology Code available for payment source | Source: Ambulatory Visit | Attending: Family Medicine | Admitting: Family Medicine

## 2020-05-24 DIAGNOSIS — Z1231 Encounter for screening mammogram for malignant neoplasm of breast: Secondary | ICD-10-CM | POA: Diagnosis present

## 2020-06-05 ENCOUNTER — Encounter: Payer: No Typology Code available for payment source | Admitting: Family Medicine

## 2020-08-01 ENCOUNTER — Encounter: Payer: No Typology Code available for payment source | Admitting: Family Medicine

## 2020-08-21 ENCOUNTER — Encounter: Payer: Self-pay | Admitting: *Deleted

## 2020-08-28 ENCOUNTER — Encounter: Payer: No Typology Code available for payment source | Admitting: Family Medicine

## 2020-10-22 ENCOUNTER — Other Ambulatory Visit: Payer: Self-pay

## 2020-10-23 ENCOUNTER — Ambulatory Visit (INDEPENDENT_AMBULATORY_CARE_PROVIDER_SITE_OTHER): Payer: No Typology Code available for payment source | Admitting: Family Medicine

## 2020-10-23 ENCOUNTER — Other Ambulatory Visit (HOSPITAL_COMMUNITY)
Admission: RE | Admit: 2020-10-23 | Discharge: 2020-10-23 | Disposition: A | Discharge status: Home / Self Care | Payer: No Typology Code available for payment source | Source: Ambulatory Visit | Attending: Family Medicine | Admitting: Family Medicine

## 2020-10-23 ENCOUNTER — Encounter: Payer: Self-pay | Admitting: Family Medicine

## 2020-10-23 VITALS — BP 110/70 | HR 68 | Temp 98.3°F | Ht 67.0 in | Wt 188.4 lb

## 2020-10-23 DIAGNOSIS — E663 Overweight: Secondary | ICD-10-CM | POA: Diagnosis not present

## 2020-10-23 DIAGNOSIS — Z0000 Encounter for general adult medical examination without abnormal findings: Secondary | ICD-10-CM | POA: Diagnosis not present

## 2020-10-23 DIAGNOSIS — E559 Vitamin D deficiency, unspecified: Secondary | ICD-10-CM | POA: Diagnosis not present

## 2020-10-23 DIAGNOSIS — Z800 Family history of malignant neoplasm of digestive organs: Secondary | ICD-10-CM

## 2020-10-23 DIAGNOSIS — Z13220 Encounter for screening for lipoid disorders: Secondary | ICD-10-CM

## 2020-10-23 DIAGNOSIS — Z124 Encounter for screening for malignant neoplasm of cervix: Secondary | ICD-10-CM | POA: Insufficient documentation

## 2020-10-23 LAB — COMPREHENSIVE METABOLIC PANEL
ALT: 24 U/L (ref 0–35)
AST: 15 U/L (ref 0–37)
Albumin: 4.5 g/dL (ref 3.5–5.2)
Alkaline Phosphatase: 83 U/L (ref 39–117)
BUN: 18 mg/dL (ref 6–23)
CO2: 27 mEq/L (ref 19–32)
Calcium: 9.5 mg/dL (ref 8.4–10.5)
Chloride: 104 mEq/L (ref 96–112)
Creatinine, Ser: 0.77 mg/dL (ref 0.40–1.20)
GFR: 89.64 mL/min (ref >60.00)
Glucose, Bld: 99 mg/dL (ref 70–99)
Potassium: 3.9 mEq/L (ref 3.5–5.1)
Sodium: 140 mEq/L (ref 135–145)
Total Bilirubin: 0.7 mg/dL (ref 0.2–1.2)
Total Protein: 6.9 g/dL (ref 6.0–8.3)

## 2020-10-23 LAB — LIPID PANEL
Cholesterol: 212 mg/dL — ABNORMAL HIGH (ref 0–200)
HDL: 50.2 mg/dL (ref >39.00)
LDL Cholesterol: 135 mg/dL — ABNORMAL HIGH (ref 0–99)
NonHDL: 161.84
Total CHOL/HDL Ratio: 4
Triglycerides: 136 mg/dL (ref 0.0–149.0)
VLDL: 27.2 mg/dL (ref 0.0–40.0)

## 2020-10-23 LAB — VITAMIN D 25 HYDROXY (VIT D DEFICIENCY, FRACTURES): VITD: 13.02 ng/mL — ABNORMAL LOW (ref 30.00–100.00)

## 2020-10-23 LAB — HEMOGLOBIN A1C: Hgb A1c MFr Bld: 5.5 % (ref 4.6–6.5)

## 2020-10-23 NOTE — Assessment & Plan Note (Signed)
Physical exam completed.  Encouraged healthy diet and continued exercise.  Discussed the need for colonoscopy given her mother's history.  Referral was placed.  Pap smear completed today.  Encourage her to have her mammogram completed in October 2022.  Discussed flu vaccine, Covid vaccine, and Shingrix vaccine.  She declines all 3 of these.  I did encourage her to get her Covid and Shingrix vaccines if she changes her mind.  Lab work as outlined.

## 2020-10-23 NOTE — Patient Instructions (Signed)
Nice to see you. We will check labs today. Somebody will call you to schedule your colonoscopy. Please continue with healthy diet and exercise.

## 2020-10-23 NOTE — Progress Notes (Signed)
Tommi Rumps, MD Phone: [phone]  Deanna Leonard is a 50 y.o. female who presents today for CPE.  Diet: Patient has worked on limiting fried foods.  Getting good vegetables.  Mostly eating chicken for protein.  She has decreased her soda and sweet tea. Exercise: Walking 3-4 times a week for 45 minutes at a time Pap smear: Due Colonoscopy: Due Mammogram: 05/14/2020, negative Family history-  Colon cancer: Mother, age 55  Breast cancer: No  Ovarian cancer: No Menses: IUD Vaccines-   Flu: Declines  Tetanus: Up-to-date  Shingles: Declines  COVID19: Declines HIV screening: Up-to-date Hep C Screening: Previously done with blood donation Tobacco use: No Alcohol use: Rare Illicit Drug use: No Dentist: Yes Ophthalmology: Yes   Active Ambulatory Problems    Diagnosis Date Noted  . Syncope 03/04/2015  . Forgetfulness 03/04/2015  . Frequent bowel movements 03/04/2015  . Need for Tdap vaccination 04/04/2015  . Routine general medical examination at a health care facility 04/04/2015  . Family history of colon cancer in mother 02/22/2019  . Vitamin D deficiency 02/22/2019   Resolved Ambulatory Problems    Diagnosis Date Noted  . Encounter to establish care 02/22/2015   No Additional Past Medical History    Family History  Problem Relation Age of Onset  . Cancer Mother 4       Colon Cancer  . Hypertension Mother   . Hypertension Father   . Diabetes Father   . Congestive Heart Failure Father   . Glaucoma Father   . Macular degeneration Father   . Breast cancer Neg Hx     Social History   Socioeconomic History  . Marital status: Married    Spouse name: Not on file  . Number of children: Not on file  . Years of education: Not on file  . Highest education level: Not on file  Occupational History  . Not on file  Tobacco Use  . Smoking status: Never Smoker  . Smokeless tobacco: Never Used  Substance and Sexual Activity  . Alcohol use: Yes     Alcohol/week: 0.0 standard drinks    Comment: rarely  . Drug use: No  . Sexual activity: Yes    Partners: Male    Birth control/protection: I.U.D.    Comment: Husband  Other Topics Concern  . 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    Lives with husband and son (51 yo)    Pets- None currently    Caffeine- Rare tea/coffee    Social Determinants of Health   Financial Resource Strain: Not on file  Food Insecurity: Not on file  Transportation Needs: Not on file  Physical Activity: Not on file  Stress: Not on file  Social Connections: Not on file  Intimate Partner Violence: Not on file    ROS  General:  Negative for nexplained weight loss, fever Skin: Negative for new or changing mole, sore that won't heal HEENT: Negative for trouble hearing, trouble seeing, ringing in ears, mouth sores, hoarseness, change in voice, dysphagia. CV:  Negative for chest pain, dyspnea, edema, palpitations Resp: Negative for cough, dyspnea, hemoptysis GI: Negative for nausea, vomiting, diarrhea, constipation, abdominal pain, melena, hematochezia. GU: Negative for dysuria, incontinence, urinary hesitance, hematuria, vaginal or penile discharge, polyuria, sexual difficulty, lumps in testicle or breasts MSK: Negative for muscle cramps or aches, joint pain or swelling Neuro: Negative for headaches, weakness, numbness, dizziness, passing out/fainting Psych: Negative for depression, anxiety, memory problems  Objective  Physical Exam Vitals:   10/23/20 0912  BP: 110/70  Pulse: 68  Temp: 98.3 F (36.8 C)  SpO2: 99%    BP Readings from Last 3 Encounters:  10/23/20 110/70  02/22/19 120/70  08/24/17 108/84   Wt Readings from Last 3 Encounters:  10/23/20 188 lb 6.4 oz (85.5 kg)  02/22/19 180 lb 12.8 oz (82 kg)  08/24/17 176 lb 9.6 oz (80.1 kg)    Physical Exam Constitutional:      General: She is not in acute distress.    Appearance: She is not diaphoretic.   HENT:     Head: Normocephalic and atraumatic.  Cardiovascular:     Rate and Rhythm: Normal rate and regular rhythm.     Heart sounds: Normal heart sounds.  Pulmonary:     Effort: Pulmonary effort is normal.     Breath sounds: Normal breath sounds.  Abdominal:     General: Bowel sounds are normal. There is no distension.     Palpations: Abdomen is soft.     Tenderness: There is no abdominal tenderness. There is no guarding or rebound.  Genitourinary:    Comments: Fulton Mole, CMA served as chaperone, normal labia, normal vaginal mucosa, normal-appearing cervix with IUD strings noted, no adnexal masses or tenderness, bilateral breast with no skin changes, nipple inversion, masses, or tenderness, no axillary masses bilaterally Musculoskeletal:     Right lower leg: No edema.     Left lower leg: No edema.  Lymphadenopathy:     Cervical: No cervical adenopathy.  Skin:    General: Skin is warm and dry.  Neurological:     Mental Status: She is alert.  Psychiatric:        Mood and Affect: Mood normal.      Assessment/Plan:   Problem List Items Addressed This Visit    Routine general medical examination at a health care facility - Primary    Physical exam completed.  Encouraged healthy diet and continued exercise.  Discussed the need for colonoscopy given her mother's history.  Referral was placed.  Pap smear completed today.  Encourage her to have her mammogram completed in October 2022.  Discussed flu vaccine, Covid vaccine, and Shingrix vaccine.  She declines all 3 of these.  I did encourage her to get her Covid and Shingrix vaccines if she changes her mind.  Lab work as outlined.      Vitamin D deficiency   Relevant Orders   Vitamin D (25 hydroxy)    Other Visit Diagnoses    Lipid screening       Relevant Orders   Comp Met (CMET)   Lipid panel   Cervical cancer screening       Relevant Orders   Cytology - PAP( [place])   Family history of colon cancer requiring  screening colonoscopy       Relevant Orders   Ambulatory referral to Gastroenterology   Overweight       Relevant Orders   HgB A1c    Will request records from her previous gynecologist to see when she needs her IUD changed.  This visit occurred during the SARS-CoV-2 public health emergency.  Safety protocols were in place, including screening questions prior to the visit, additional usage of staff PPE, and extensive cleaning of exam room while observing appropriate contact time as indicated for disinfecting solutions.    Tommi Rumps, MD [place]

## 2020-10-24 LAB — CYTOLOGY - PAP
Comment: NEGATIVE
Diagnosis: NEGATIVE
High risk HPV: NEGATIVE

## 2020-10-26 ENCOUNTER — Other Ambulatory Visit: Payer: Self-pay | Admitting: Family Medicine

## 2020-10-26 DIAGNOSIS — E559 Vitamin D deficiency, unspecified: Secondary | ICD-10-CM

## 2020-10-26 MED ORDER — VITAMIN D (ERGOCALCIFEROL) 1.25 MG (50000 UNIT) PO CAPS: 50000.0000 [IU] | ORAL_CAPSULE | ORAL | 0 refills | Status: DC

## 2020-11-06 ENCOUNTER — Encounter: Payer: Self-pay | Admitting: Family Medicine

## 2020-11-14 ENCOUNTER — Encounter: Payer: Self-pay | Admitting: *Deleted

## 2020-12-26 ENCOUNTER — Other Ambulatory Visit: Payer: No Typology Code available for payment source

## 2021-01-14 ENCOUNTER — Other Ambulatory Visit: Payer: Medicaid Other

## 2021-04-11 ENCOUNTER — Other Ambulatory Visit: Payer: Self-pay | Admitting: Family Medicine

## 2021-04-11 DIAGNOSIS — Z1231 Encounter for screening mammogram for malignant neoplasm of breast: Secondary | ICD-10-CM

## 2021-05-27 ENCOUNTER — Ambulatory Visit: Payer: Medicaid Other

## 2021-06-04 ENCOUNTER — Ambulatory Visit
Admission: RE | Admit: 2021-06-04 | Discharge: 2021-06-04 | Disposition: A | Discharge status: Home / Self Care | Payer: 59 | Source: Ambulatory Visit | Attending: Family Medicine | Admitting: Family Medicine

## 2021-06-04 ENCOUNTER — Other Ambulatory Visit: Payer: Self-pay

## 2021-06-04 DIAGNOSIS — Z1231 Encounter for screening mammogram for malignant neoplasm of breast: Secondary | ICD-10-CM | POA: Insufficient documentation

## 2021-10-29 ENCOUNTER — Ambulatory Visit: Payer: 59 | Admitting: Family Medicine

## 2021-10-29 VITALS — Wt 188.0 lb

## 2021-10-29 DIAGNOSIS — E663 Overweight: Secondary | ICD-10-CM

## 2021-10-29 DIAGNOSIS — E559 Vitamin D deficiency, unspecified: Secondary | ICD-10-CM

## 2021-10-29 DIAGNOSIS — Z0000 Encounter for general adult medical examination without abnormal findings: Secondary | ICD-10-CM

## 2021-10-29 DIAGNOSIS — Z13220 Encounter for screening for lipoid disorders: Secondary | ICD-10-CM

## 2021-10-30 NOTE — Progress Notes (Signed)
Patient cancelled. Encounter opened in error.

## 2021-12-24 ENCOUNTER — Encounter: Payer: 59 | Admitting: Family Medicine

## 2022-02-04 ENCOUNTER — Encounter: Payer: 59 | Admitting: Family Medicine

## 2022-03-12 ENCOUNTER — Encounter: Payer: 59 | Admitting: Family Medicine

## 2022-05-13 ENCOUNTER — Encounter: Payer: 59 | Admitting: Family Medicine

## 2022-07-23 ENCOUNTER — Encounter: Payer: 59 | Admitting: Family Medicine

## 2022-08-20 ENCOUNTER — Encounter: Payer: Self-pay | Admitting: Family Medicine

## 2022-08-20 ENCOUNTER — Ambulatory Visit (INDEPENDENT_AMBULATORY_CARE_PROVIDER_SITE_OTHER): Payer: 59 | Admitting: Family Medicine

## 2022-08-20 VITALS — BP 100/60 | HR 69 | Temp 97.9°F | Ht 67.0 in | Wt 185.8 lb

## 2022-08-20 DIAGNOSIS — Z0000 Encounter for general adult medical examination without abnormal findings: Secondary | ICD-10-CM | POA: Diagnosis not present

## 2022-08-20 DIAGNOSIS — E559 Vitamin D deficiency, unspecified: Secondary | ICD-10-CM | POA: Diagnosis not present

## 2022-08-20 DIAGNOSIS — Z1211 Encounter for screening for malignant neoplasm of colon: Secondary | ICD-10-CM | POA: Diagnosis not present

## 2022-08-20 DIAGNOSIS — E663 Overweight: Secondary | ICD-10-CM

## 2022-08-20 DIAGNOSIS — Z800 Family history of malignant neoplasm of digestive organs: Secondary | ICD-10-CM

## 2022-08-20 DIAGNOSIS — Z13220 Encounter for screening for lipoid disorders: Secondary | ICD-10-CM

## 2022-08-20 DIAGNOSIS — Z1231 Encounter for screening mammogram for malignant neoplasm of breast: Secondary | ICD-10-CM

## 2022-08-20 LAB — COMPREHENSIVE METABOLIC PANEL
ALT: 41 U/L — ABNORMAL HIGH (ref 0–35)
AST: 21 U/L (ref 0–37)
Albumin: 4.5 g/dL (ref 3.5–5.2)
Alkaline Phosphatase: 103 U/L (ref 39–117)
BUN: 15 mg/dL (ref 6–23)
CO2: 29 mEq/L (ref 19–32)
Calcium: 9.2 mg/dL (ref 8.4–10.5)
Chloride: 102 mEq/L (ref 96–112)
Creatinine, Ser: 0.77 mg/dL (ref 0.40–1.20)
GFR: 88.5 mL/min (ref >60.00)
Glucose, Bld: 89 mg/dL (ref 70–99)
Potassium: 4.1 mEq/L (ref 3.5–5.1)
Sodium: 139 mEq/L (ref 135–145)
Total Bilirubin: 0.7 mg/dL (ref 0.2–1.2)
Total Protein: 6.6 g/dL (ref 6.0–8.3)

## 2022-08-20 LAB — LIPID PANEL
Cholesterol: 224 mg/dL — ABNORMAL HIGH (ref 0–200)
HDL: 50.9 mg/dL (ref >39.00)
LDL Cholesterol: 143 mg/dL — ABNORMAL HIGH (ref 0–99)
NonHDL: 173.46
Total CHOL/HDL Ratio: 4
Triglycerides: 152 mg/dL — ABNORMAL HIGH (ref 0.0–149.0)
VLDL: 30.4 mg/dL (ref 0.0–40.0)

## 2022-08-20 LAB — VITAMIN D 25 HYDROXY (VIT D DEFICIENCY, FRACTURES): VITD: 10.23 ng/mL — ABNORMAL LOW (ref 30.00–100.00)

## 2022-08-20 LAB — HEMOGLOBIN A1C: Hgb A1c MFr Bld: 5.8 % (ref 4.6–6.5)

## 2022-08-20 NOTE — Patient Instructions (Signed)
Nice to see you. Please call your gynecologist and check on when your IUD needs to come out. Please call [phone] to schedule your mammogram. GI should contact you to schedule your colonoscopy. If you would like the shingles vaccine in our office please let us know.

## 2022-08-20 NOTE — Assessment & Plan Note (Signed)
Physical exam completed.  I encouraged increasing her exercise and reducing her sweet food and junk food intake.  GI referral placed for colon cancer screening.  Mammogram ordered and patient will call to schedule this.  Pap smear is up-to-date.  I encouraged her to contact her GYN to check on when her IUD needs to be replaced.  She declines COVID and flu vaccination.  She will consider the Shingrix vaccine and let us know if she would like to do that.  Lab work as outlined.

## 2022-08-20 NOTE — Progress Notes (Signed)
Tommi Rumps, MD Phone: [phone]  Deanna Leonard is a 52 y.o. female who presents today for CPE.  Diet: intermittently works on her diet, does eat a bigger meal at dinner, does have some sweets and junk food Exercise: walks some 1-2x/week Pap smear: 10/23/20 NILM neg HPV Colonoscopy: due Mammogram: due Family history-  Colon cancer: mother  Breast cancer: no  Ovarian cancer: no Menses: IUD, no menses, IUD has been in since 2019 Vaccines-   Flu: declines  Tetanus: UTD  Shingles: due  COVID19: declines HIV screening: UTD Hep C Screening: UTD, has donated blood Tobacco use: no Alcohol use: rare Illicit Drug use: no Dentist: yes Ophthalmology: yes   Active Ambulatory Problems    Diagnosis Date Noted   Routine general medical examination at a health care facility 04/04/2015   Family history of colon cancer in mother 02/22/2019   Vitamin D deficiency 02/22/2019   Resolved Ambulatory Problems    Diagnosis Date Noted   Encounter to establish care 02/22/2015   Syncope 03/04/2015   Forgetfulness 03/04/2015   Frequent bowel movements 03/04/2015   Need for Tdap vaccination 04/04/2015   Past Medical History:  Diagnosis Date   IUD (intrauterine device) in place     Family History  Problem Relation Age of Onset   Cancer Mother 22       Colon Cancer   Hypertension Mother    Hypertension Father    Diabetes Father    Congestive Heart Failure Father    Glaucoma Father    Macular degeneration Father    Breast cancer Neg Hx     Social History   Socioeconomic History   Marital status: Married    Spouse name: Not on file   Number of children: Not on file   Years of education: Not on file   Highest education level: Not on file  Occupational History   Not on file  Tobacco Use   Smoking status: Never   Smokeless tobacco: Never  Substance and Sexual Activity   Alcohol use: Yes    Alcohol/week: 0.0 standard drinks of alcohol    Comment: rarely   Drug use:  No   Sexual activity: Yes    Partners: Male    Birth control/protection: I.U.D.    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    Lives with husband and son (5 yo)    Pets- None currently    Caffeine- Rare tea/coffee    Social Determinants of Health   Financial Resource Strain: Not on file  Food Insecurity: Not on file  Transportation Needs: Not on file  Physical Activity: Not on file  Stress: Not on file  Social Connections: Not on file  Intimate Partner Violence: Not on file    ROS  General:  Negative for nexplained weight loss, fever Skin: Negative for new or changing mole, sore that won't heal HEENT: Negative for trouble hearing, trouble seeing, ringing in ears, mouth sores, hoarseness, change in voice, dysphagia. CV:  Negative for chest pain, dyspnea, edema, palpitations Resp: Negative for cough, dyspnea, hemoptysis GI: Negative for nausea, vomiting, diarrhea, constipation, abdominal pain, melena, hematochezia. GU: Negative for dysuria, incontinence, urinary hesitance, hematuria, vaginal or penile discharge, polyuria, sexual difficulty, lumps in testicle or breasts MSK: Negative for muscle cramps or aches, joint pain or swelling Neuro: Negative for headaches, weakness, numbness, dizziness, passing out/fainting Psych: Negative for depression, anxiety, memory problems  Objective  Physical Exam Vitals:   08/20/22 0926  BP: 100/60  Pulse: 69  Temp: 97.9 F (36.6 C)  SpO2: 98%    BP Readings from Last 3 Encounters:  08/20/22 100/60  10/23/20 110/70  02/22/19 120/70   Wt Readings from Last 3 Encounters:  08/20/22 185 lb 12.8 oz (84.3 kg)  10/29/21 188 lb (85.3 kg)  10/23/20 188 lb 6.4 oz (85.5 kg)    Physical Exam Constitutional:      General: She is not in acute distress.    Appearance: She is not diaphoretic.  HENT:     Head: Normocephalic and atraumatic.  Cardiovascular:      Rate and Rhythm: Normal rate and regular rhythm.     Heart sounds: Normal heart sounds.  Pulmonary:     Effort: Pulmonary effort is normal.     Breath sounds: Normal breath sounds.  Abdominal:     General: Bowel sounds are normal. There is no distension.     Palpations: Abdomen is soft.     Tenderness: There is no abdominal tenderness.  Musculoskeletal:     Right lower leg: No edema.     Left lower leg: No edema.  Lymphadenopathy:     Cervical: No cervical adenopathy.  Skin:    General: Skin is warm and dry.  Neurological:     Mental Status: She is alert.  Psychiatric:        Mood and Affect: Mood normal.      Assessment/Plan:   Routine general medical examination at a health care facility Assessment & Plan: Physical exam completed.  I encouraged increasing her exercise and reducing her sweet food and junk food intake.  GI referral placed for colon cancer screening.  Mammogram ordered and patient will call to schedule this.  Pap smear is up-to-date.  I encouraged her to contact her GYN to check on when her IUD needs to be replaced.  She declines COVID and flu vaccination.  She will consider the Shingrix vaccine and let us know if she would like to do that.  Lab work as outlined.   Vitamin D deficiency -     VITAMIN D 25 Hydroxy (Vit-D Deficiency, Fractures)  Colon cancer screening -     Ambulatory referral to Gastroenterology  Family history of colon cancer in mother -     Ambulatory referral to Gastroenterology  Lipid screening -     Comprehensive metabolic panel -     Lipid panel  Overweight -     Hemoglobin A1c  Encounter for screening mammogram for malignant neoplasm of breast -     3D Screening Mammogram, Left and Right; Future    Return in about 1 year (around 08/21/2023) for physical.   Tommi Rumps, MD [place]

## 2022-08-26 ENCOUNTER — Other Ambulatory Visit: Payer: Self-pay

## 2022-08-26 DIAGNOSIS — R7401 Elevation of levels of liver transaminase levels: Secondary | ICD-10-CM

## 2022-09-02 ENCOUNTER — Ambulatory Visit
Admission: RE | Admit: 2022-09-02 | Discharge: 2022-09-02 | Disposition: A | Discharge status: Home / Self Care | Payer: 59 | Source: Ambulatory Visit | Attending: Family Medicine | Admitting: Family Medicine

## 2022-09-02 DIAGNOSIS — Z1231 Encounter for screening mammogram for malignant neoplasm of breast: Secondary | ICD-10-CM | POA: Insufficient documentation

## 2022-09-09 ENCOUNTER — Other Ambulatory Visit (INDEPENDENT_AMBULATORY_CARE_PROVIDER_SITE_OTHER): Payer: 59

## 2022-09-09 DIAGNOSIS — R7401 Elevation of levels of liver transaminase levels: Secondary | ICD-10-CM | POA: Diagnosis not present

## 2022-09-09 LAB — HEPATIC FUNCTION PANEL
ALT: 25 U/L (ref 0–35)
AST: 14 U/L (ref 0–37)
Albumin: 4.2 g/dL (ref 3.5–5.2)
Alkaline Phosphatase: 90 U/L (ref 39–117)
Bilirubin, Direct: 0.1 mg/dL (ref 0.0–0.3)
Total Bilirubin: 0.7 mg/dL (ref 0.2–1.2)
Total Protein: 6.2 g/dL (ref 6.0–8.3)

## 2022-09-17 ENCOUNTER — Encounter: Payer: Self-pay | Admitting: *Deleted

## 2023-05-25 IMAGING — MG MM DIGITAL SCREENING BILAT W/ TOMO AND CAD
8 series · 8 of 24 positions shown · non-contrast
Comparison: Previous exam(s).

CLINICAL DATA: Screening.

EXAM:
DIGITAL SCREENING BILATERAL MAMMOGRAM WITH TOMOSYNTHESIS AND CAD
TECHNIQUE: Bilateral screening digital craniocaudal and mediolateral oblique
mammograms were obtained. Bilateral screening digital breast
tomosynthesis was performed. The images were evaluated with
computer-aided detection.

[L CC synth-2D]
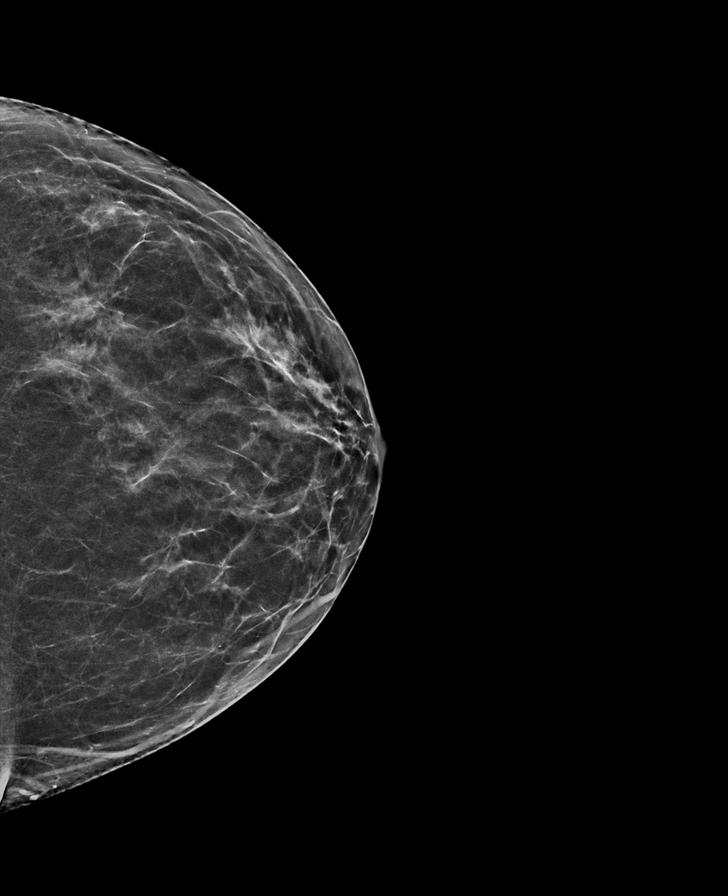

[R CC synth-2D]
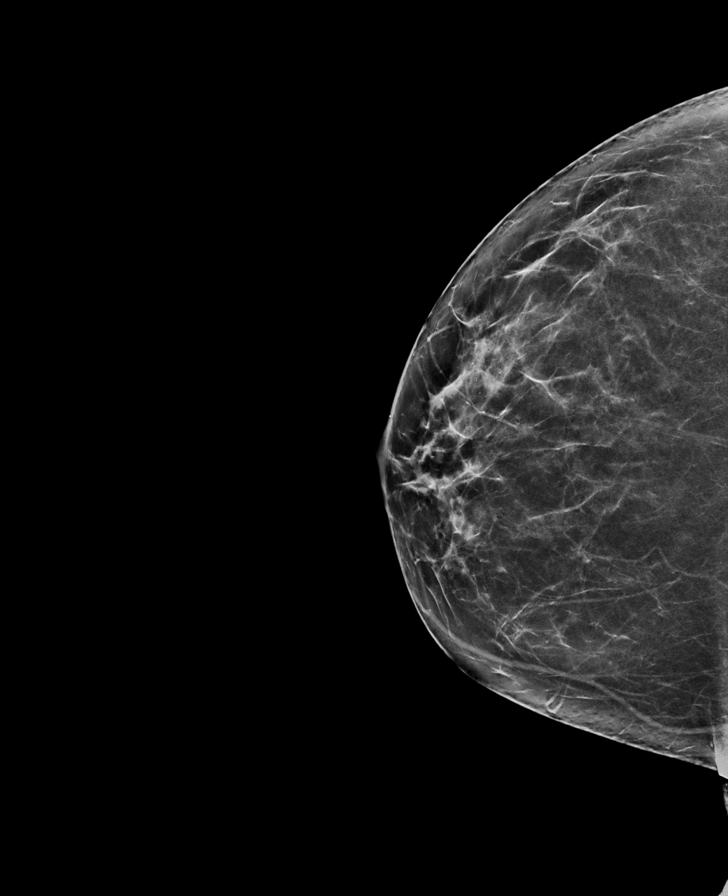

[R MLO synth-2D]
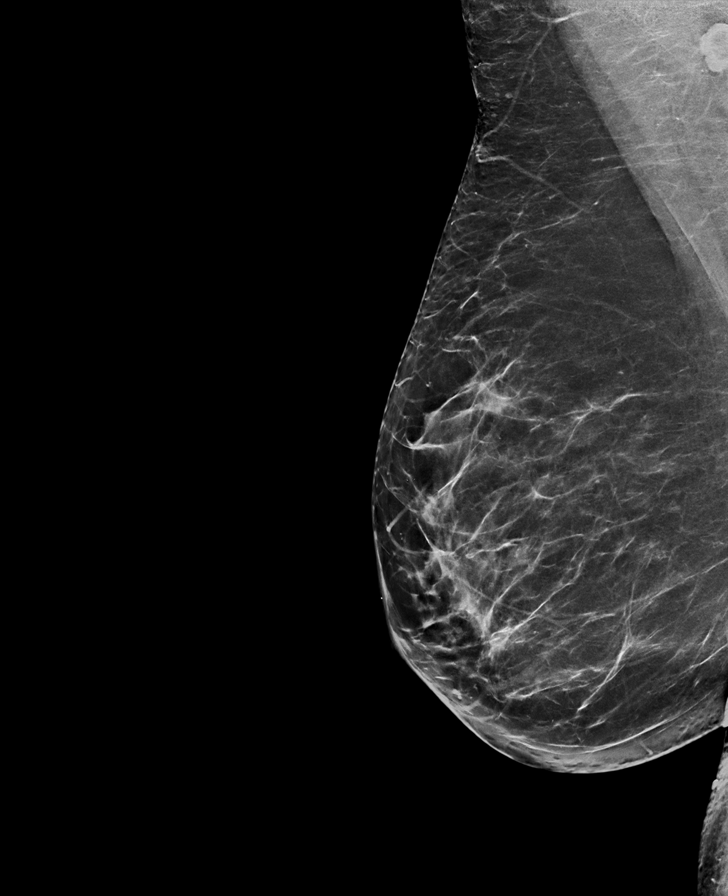

[L MLO synth-2D]
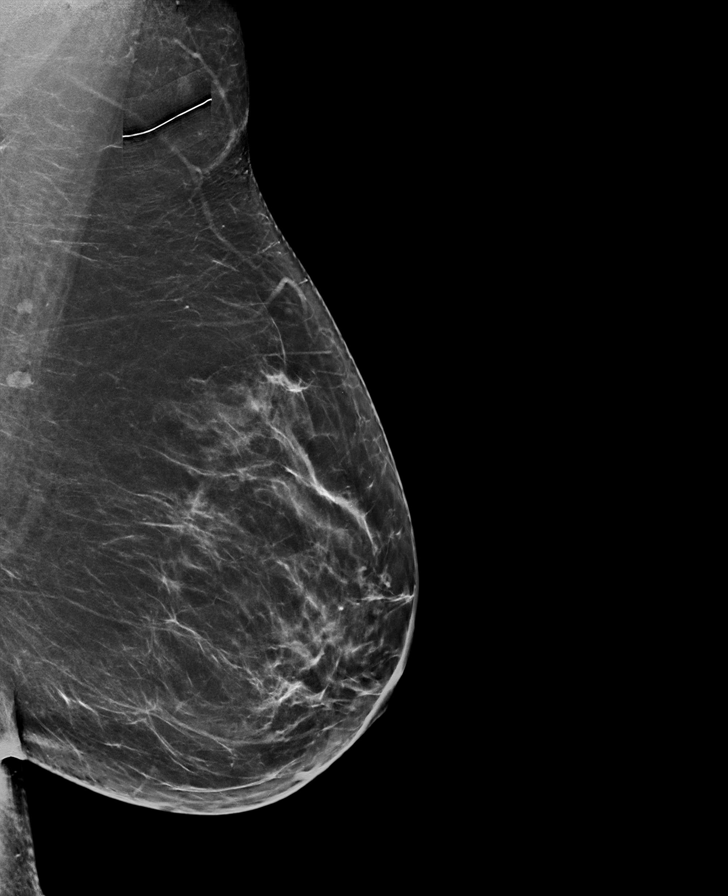

[L MLO tomo · tomo slice 39/78.0]
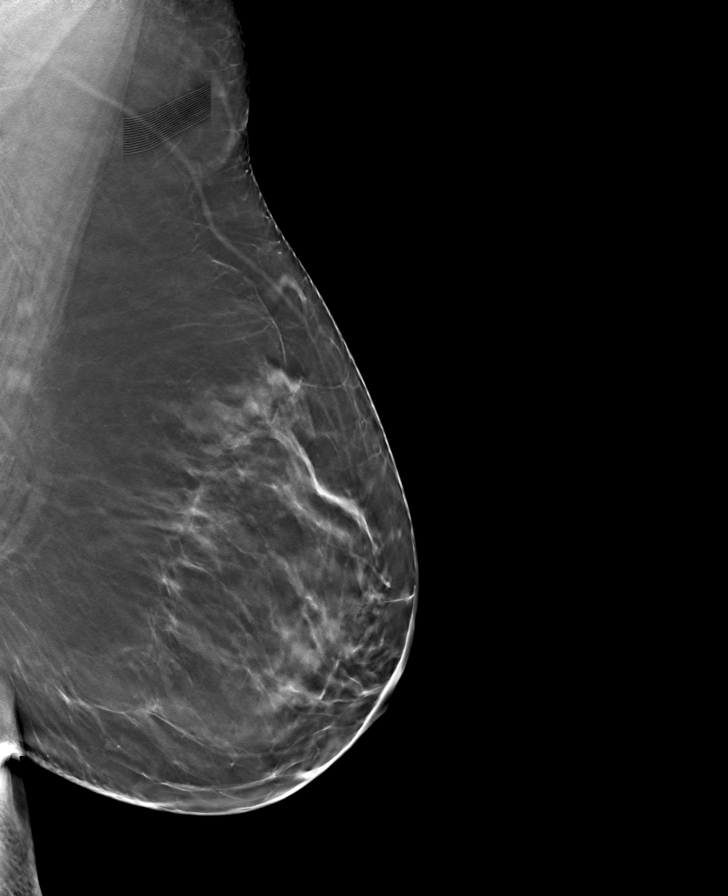

[R CC tomo · tomo slice 35/68.0]
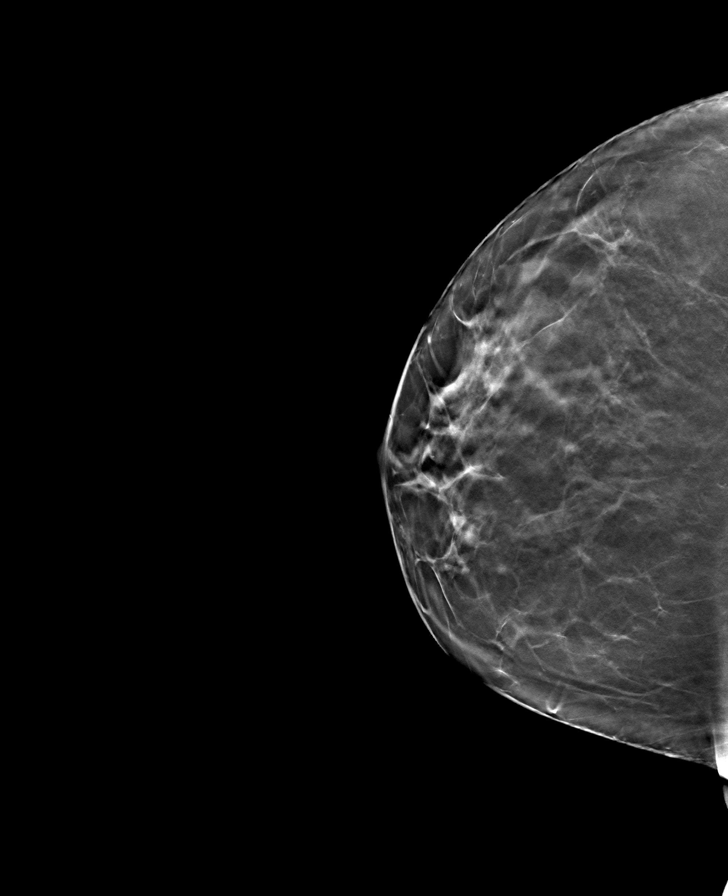

[R MLO tomo · tomo slice 40/79.0]
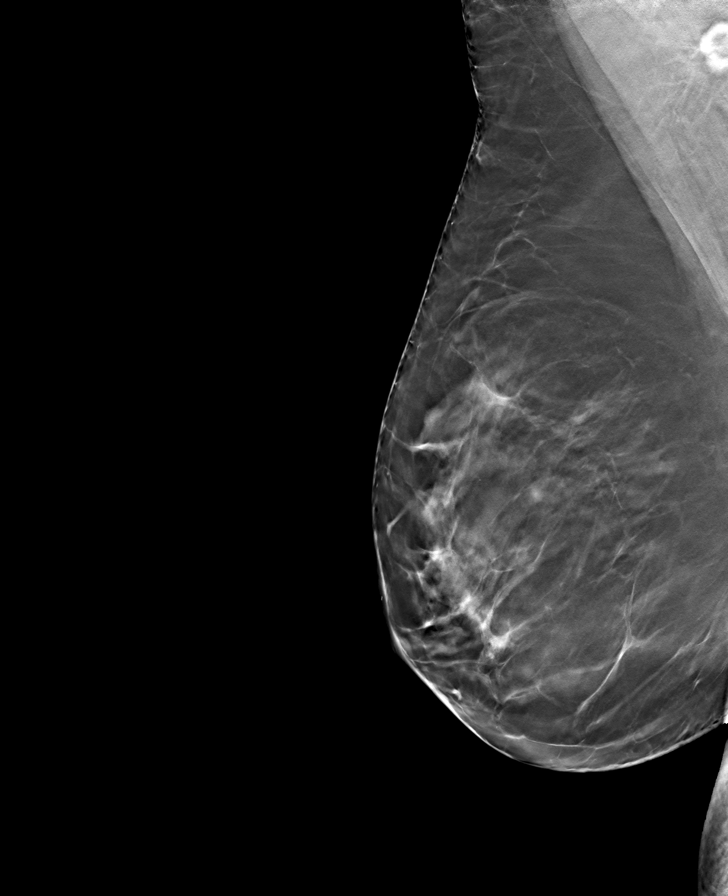

[L CC tomo · tomo slice 31/61.0]
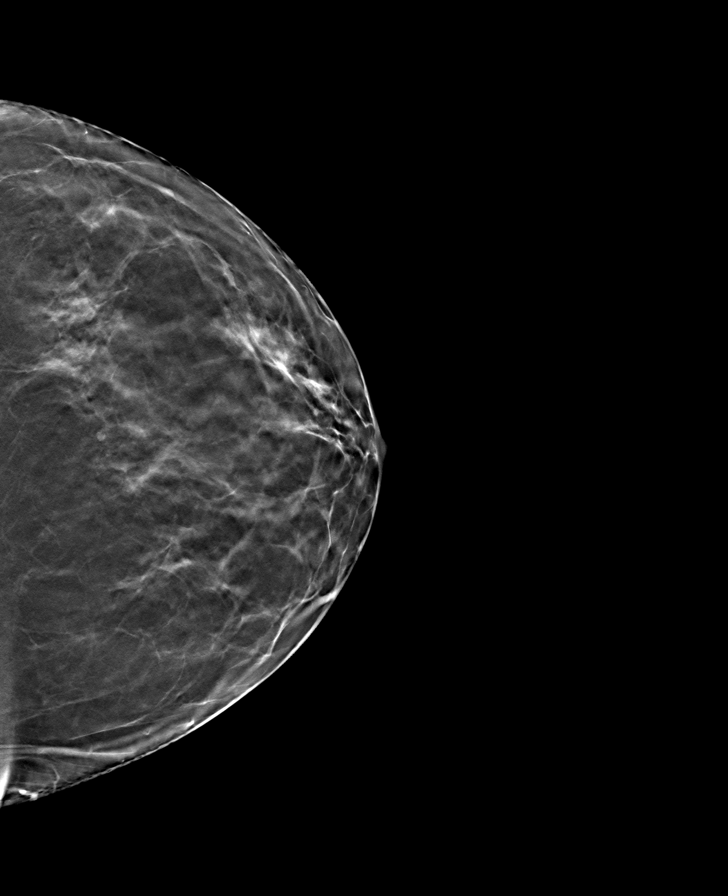

[8 of 24 positions shown; findings below may reference images not displayed]

ACR Breast Density Category b: There are scattered areas of
fibroglandular density.
FINDINGS: There are no findings suspicious for malignancy.
IMPRESSION: No mammographic evidence of malignancy. A result letter of this
screening mammogram will be mailed directly to the patient.

RECOMMENDATION:
Screening mammogram in one year. (Code:51-O-LD2)

BI-RADS CATEGORY  1: Negative.

## 2023-08-26 ENCOUNTER — Encounter: Payer: 59 | Admitting: Family Medicine

## 2023-10-06 ENCOUNTER — Encounter: Payer: 59 | Admitting: Nurse Practitioner

## 2023-11-03 ENCOUNTER — Ambulatory Visit (INDEPENDENT_AMBULATORY_CARE_PROVIDER_SITE_OTHER): Admitting: Nurse Practitioner

## 2023-11-03 VITALS — BP 118/68 | HR 65 | Temp 97.7°F | Ht 67.0 in | Wt 186.6 lb

## 2023-11-03 DIAGNOSIS — E559 Vitamin D deficiency, unspecified: Secondary | ICD-10-CM

## 2023-11-03 DIAGNOSIS — Z13220 Encounter for screening for lipoid disorders: Secondary | ICD-10-CM | POA: Diagnosis not present

## 2023-11-03 DIAGNOSIS — Z1231 Encounter for screening mammogram for malignant neoplasm of breast: Secondary | ICD-10-CM

## 2023-11-03 DIAGNOSIS — Z1329 Encounter for screening for other suspected endocrine disorder: Secondary | ICD-10-CM

## 2023-11-03 DIAGNOSIS — Z1211 Encounter for screening for malignant neoplasm of colon: Secondary | ICD-10-CM

## 2023-11-03 DIAGNOSIS — Z800 Family history of malignant neoplasm of digestive organs: Secondary | ICD-10-CM

## 2023-11-03 DIAGNOSIS — R7303 Prediabetes: Secondary | ICD-10-CM

## 2023-11-03 DIAGNOSIS — Z0000 Encounter for general adult medical examination without abnormal findings: Secondary | ICD-10-CM | POA: Diagnosis not present

## 2023-11-03 LAB — COMPREHENSIVE METABOLIC PANEL WITH GFR
ALT: 25 U/L (ref 0–35)
AST: 16 U/L (ref 0–37)
Albumin: 4.6 g/dL (ref 3.5–5.2)
Alkaline Phosphatase: 102 U/L (ref 39–117)
BUN: 14 mg/dL (ref 6–23)
CO2: 28 meq/L (ref 19–32)
Calcium: 9.1 mg/dL (ref 8.4–10.5)
Chloride: 104 meq/L (ref 96–112)
Creatinine, Ser: 0.81 mg/dL (ref 0.40–1.20)
GFR: 82.58 mL/min (ref >60.00)
Glucose, Bld: 100 mg/dL — ABNORMAL HIGH (ref 70–99)
Potassium: 4.1 meq/L (ref 3.5–5.1)
Sodium: 141 meq/L (ref 135–145)
Total Bilirubin: 0.8 mg/dL (ref 0.2–1.2)
Total Protein: 6.7 g/dL (ref 6.0–8.3)

## 2023-11-03 LAB — CBC WITH DIFFERENTIAL/PLATELET
Basophils Absolute: 0 10*3/uL (ref 0.0–0.1)
Basophils Relative: 0.4 % (ref 0.0–3.0)
Eosinophils Absolute: 0.1 10*3/uL (ref 0.0–0.7)
Eosinophils Relative: 1 % (ref 0.0–5.0)
HCT: 41.9 % (ref 36.0–46.0)
Hemoglobin: 14.6 g/dL (ref 12.0–15.0)
Lymphocytes Relative: 30.1 % (ref 12.0–46.0)
Lymphs Abs: 1.9 10*3/uL (ref 0.7–4.0)
MCHC: 35 g/dL (ref 30.0–36.0)
MCV: 97.8 fl (ref 78.0–100.0)
Monocytes Absolute: 0.3 10*3/uL (ref 0.1–1.0)
Monocytes Relative: 5.6 % (ref 3.0–12.0)
Neutro Abs: 3.9 10*3/uL (ref 1.4–7.7)
Neutrophils Relative %: 62.9 % (ref 43.0–77.0)
Platelets: 226 10*3/uL (ref 150.0–400.0)
RBC: 4.28 Mil/uL (ref 3.87–5.11)
RDW: 12.9 % (ref 11.5–15.5)
WBC: 6.2 10*3/uL (ref 4.0–10.5)

## 2023-11-03 LAB — HEMOGLOBIN A1C: Hgb A1c MFr Bld: 5.7 % (ref 4.6–6.5)

## 2023-11-03 LAB — LIPID PANEL
Cholesterol: 218 mg/dL — ABNORMAL HIGH (ref 0–200)
HDL: 46.8 mg/dL (ref >39.00)
LDL Cholesterol: 133 mg/dL — ABNORMAL HIGH (ref 0–99)
NonHDL: 170.9
Total CHOL/HDL Ratio: 5
Triglycerides: 190 mg/dL — ABNORMAL HIGH (ref 0.0–149.0)
VLDL: 38 mg/dL (ref 0.0–40.0)

## 2023-11-03 LAB — TSH: TSH: 1.76 u[IU]/mL (ref 0.35–5.50)

## 2023-11-03 LAB — VITAMIN D 25 HYDROXY (VIT D DEFICIENCY, FRACTURES): VITD: 17.42 ng/mL — ABNORMAL LOW (ref 30.00–100.00)

## 2023-11-03 NOTE — Patient Instructions (Addendum)
 YOUR MAMMOGRAM IS DUE, PLEASE CALL AND GET THIS SCHEDULED! [facility]: [phone]

## 2023-11-03 NOTE — Progress Notes (Unsigned)
 Bethanie Dicker, NP-C Phone: [phone]  Deanna Leonard is a 53 y.o. female who presents today for transfer of care and annual exam.   Discussed the use of AI scribe software for clinical note transcription with the patient, who gave verbal consent to proceed.  History of Present Illness   Deanna Leonard is a 53 year old female who presents for an annual physical exam.  She has no new problems or concerns since her last visit. She has not yet undergone a colonoscopy despite a family history of colon cancer in her mother.  She has a history of low vitamin D levels and was previously on a high-dose supplement. Currently, she takes vitamin D supplements inconsistently, stating she takes them 'when I think about it.'  She has an IUD in place and does not experience menstrual periods. She has not received a flu shot or COVID vaccines, and her last tetanus shot was in 2016. She has not received the shingles vaccine yet.  She does not smoke and drinks alcohol only on special occasions. She tries to walk for exercise when possible. Her diet includes occasional sweets and fried foods, and she could increase her vegetable intake. She drinks about one soda per day and primarily cooks at home.  No chest pain, shortness of breath, abdominal pain, urinary symptoms, headaches, dizziness, trouble swallowing, skin changes, rashes, swelling in her legs, or mood issues. She reports sleeping well once she falls asleep, although she sometimes stays up late watching TV.      Social History   Tobacco Use  Smoking Status Never  Smokeless Tobacco Never    Current Outpatient Medications on File Prior to Visit  Medication Sig Dispense Refill   Vitamin D, Ergocalciferol, (DRISDOL) 1.25 MG (50000 UNIT) CAPS capsule Take 1 capsule (50,000 Units total) by mouth every 7 (seven) days. 8 capsule 0   No current facility-administered medications on file prior to visit.    ROS see history of present  illness  Objective  Physical Exam Vitals:   11/03/23 0808  BP: 118/68  Pulse: 65  Temp: 97.7 F (36.5 C)  SpO2: 99%    BP Readings from Last 3 Encounters:  11/03/23 118/68  08/20/22 100/60  10/23/20 110/70   Wt Readings from Last 3 Encounters:  11/03/23 186 lb 9.6 oz (84.6 kg)  08/20/22 185 lb 12.8 oz (84.3 kg)  10/29/21 188 lb (85.3 kg)    Physical Exam Constitutional:      General: She is not in acute distress.    Appearance: Normal appearance.  HENT:     Head: Normocephalic.     Right Ear: Tympanic membrane normal.     Left Ear: Tympanic membrane normal.     Nose: Nose normal.     Mouth/Throat:     Mouth: Mucous membranes are moist.     Pharynx: Oropharynx is clear.  Eyes:     Conjunctiva/sclera: Conjunctivae normal.     Pupils: Pupils are equal, round, and reactive to light.  Neck:     Thyroid: No thyromegaly.  Cardiovascular:     Rate and Rhythm: Normal rate and regular rhythm.     Heart sounds: Normal heart sounds.  Pulmonary:     Effort: Pulmonary effort is normal.     Breath sounds: Normal breath sounds.  Abdominal:     General: Abdomen is flat. Bowel sounds are normal.     Palpations: Abdomen is soft. There is no mass.     Tenderness: There is  no abdominal tenderness.  Musculoskeletal:        General: Normal range of motion.  Lymphadenopathy:     Cervical: No cervical adenopathy.  Skin:    General: Skin is warm and dry.     Findings: No rash.  Neurological:     General: No focal deficit present.     Mental Status: She is alert.  Psychiatric:        Mood and Affect: Mood normal.        Behavior: Behavior normal.     Assessment/Plan: Please see individual problem list.  Preventative health care Assessment & Plan: Physical exam complete. We will order lab work as outlined and contact patient with results. A mammogram and colonoscopy is due. Pap smear is up to date. She is hesitant about the shingles vaccine due to concerns about side  effects. Tetanus vaccine is up to date. She declines flu and COVID vaccines. Her diet could be improved by reducing soda intake and increasing vegetable consumption. Order a mammogram, refer to GI, provide information on the shingles vaccine, and encourage dietary improvements. Continue routine dental and eye exams. Encourage regular exercise. Return to care in one year, sooner as needed.  Orders: -     CBC with Differential/Platelet -     Comprehensive metabolic panel with GFR  Vitamin D deficiency Assessment & Plan: History of low levels due to inconsistent supplementation. Encourage consistent vitamin D supplementation. We will check vitamin D level today.  Orders: -     VITAMIN D 25 Hydroxy (Vit-D Deficiency, Fractures)  Prediabetes Assessment & Plan: Last A1c- 5.8. Encourage healthy diet and regular exercise.   Orders: -     Hemoglobin A1c  Family history of colon cancer in mother Assessment & Plan: She has a family history of colon cancer and has not yet undergone a colonoscopy despite previous discussions. Resend a referral to GI for colonoscopy.  Orders: -     Ambulatory referral to Gastroenterology  Screen for colon cancer -     Ambulatory referral to Gastroenterology  Screening mammogram for breast cancer -     3D Screening Mammogram, Left and Right; Future  Thyroid disorder screen -     TSH  Lipid screening -     Lipid panel     Return in about 1 year (around 11/02/2024) for Annual Exam, sooner as needed.   Bethanie Dicker, NP-C [facility]

## 2023-11-04 ENCOUNTER — Encounter: Payer: Self-pay | Admitting: Nurse Practitioner

## 2023-11-04 NOTE — Assessment & Plan Note (Signed)
 History of low levels due to inconsistent supplementation. Encourage consistent vitamin D supplementation. We will check vitamin D level today.

## 2023-11-04 NOTE — Assessment & Plan Note (Signed)
 Last A1c- 5.8. Encourage healthy diet and regular exercise.

## 2023-11-04 NOTE — Assessment & Plan Note (Signed)
 Physical exam complete. We will order lab work as outlined and contact patient with results. A mammogram and colonoscopy is due. Pap smear is up to date. She is hesitant about the shingles vaccine due to concerns about side effects. Tetanus vaccine is up to date. She declines flu and COVID vaccines. Her diet could be improved by reducing soda intake and increasing vegetable consumption. Order a mammogram, refer to GI, provide information on the shingles vaccine, and encourage dietary improvements. Continue routine dental and eye exams. Encourage regular exercise. Return to care in one year, sooner as needed.

## 2023-11-04 NOTE — Assessment & Plan Note (Signed)
 She has a family history of colon cancer and has not yet undergone a colonoscopy despite previous discussions. Resend a referral to GI for colonoscopy.

## 2023-11-10 ENCOUNTER — Other Ambulatory Visit: Payer: Self-pay | Admitting: Nurse Practitioner

## 2023-11-10 ENCOUNTER — Ambulatory Visit
Admission: RE | Admit: 2023-11-10 | Discharge: 2023-11-10 | Disposition: A | Discharge status: Home / Self Care | Source: Ambulatory Visit | Attending: Nurse Practitioner | Admitting: Nurse Practitioner

## 2023-11-10 ENCOUNTER — Encounter: Payer: Self-pay | Admitting: Nurse Practitioner

## 2023-11-10 DIAGNOSIS — Z1231 Encounter for screening mammogram for malignant neoplasm of breast: Secondary | ICD-10-CM | POA: Diagnosis present

## 2023-11-10 DIAGNOSIS — E559 Vitamin D deficiency, unspecified: Secondary | ICD-10-CM

## 2023-11-10 MED ORDER — VITAMIN D (ERGOCALCIFEROL) 1.25 MG (50000 UNIT) PO CAPS: 50000.0000 [IU] | ORAL_CAPSULE | ORAL | 1 refills | Status: AC

## 2023-11-12 ENCOUNTER — Encounter: Payer: Self-pay | Admitting: Nurse Practitioner

## 2023-11-17 ENCOUNTER — Encounter: Payer: Self-pay | Admitting: *Deleted

## 2024-11-03 ENCOUNTER — Encounter: Admitting: Nurse Practitioner
